# Patient Record
Sex: Female | Born: 1935 | Race: White | Hispanic: No | Smoking: Former smoker
Health system: Southern US, Community
[De-identification: ages and names within clinical notes are randomized; demographics above are authoritative.]

## PROBLEM LIST (undated history)

## (undated) ENCOUNTER — Emergency Department (HOSPITAL_COMMUNITY): Payer: Medicare Other

## (undated) DIAGNOSIS — C801 Malignant (primary) neoplasm, unspecified: Secondary | ICD-10-CM

## (undated) DIAGNOSIS — I639 Cerebral infarction, unspecified: Secondary | ICD-10-CM

## (undated) DIAGNOSIS — E785 Hyperlipidemia, unspecified: Secondary | ICD-10-CM

## (undated) DIAGNOSIS — M8580 Other specified disorders of bone density and structure, unspecified site: Secondary | ICD-10-CM

## (undated) DIAGNOSIS — N3281 Overactive bladder: Secondary | ICD-10-CM

## (undated) DIAGNOSIS — M1990 Unspecified osteoarthritis, unspecified site: Secondary | ICD-10-CM

## (undated) DIAGNOSIS — S82002A Unspecified fracture of left patella, initial encounter for closed fracture: Secondary | ICD-10-CM

## (undated) HISTORY — PX: ANKLE FRACTURE SURGERY: SHX122

## (undated) HISTORY — DX: Other specified disorders of bone density and structure, unspecified site: M85.80

## (undated) HISTORY — PX: GREAT TOE ARTHRODESIS, INTERPHALANGEAL JOINT: SUR55

## (undated) HISTORY — PX: OTHER SURGICAL HISTORY: SHX169

## (undated) HISTORY — PX: TONSILLECTOMY AND ADENOIDECTOMY: SUR1326

---

## 2004-03-29 ENCOUNTER — Ambulatory Visit (HOSPITAL_COMMUNITY): Admission: RE | Admit: 2004-03-29 | Discharge: 2004-03-29 | Payer: Self-pay | Admitting: Family Medicine

## 2007-08-19 ENCOUNTER — Other Ambulatory Visit: Admission: RE | Admit: 2007-08-19 | Discharge: 2007-08-19 | Payer: Self-pay | Admitting: Family Medicine

## 2010-09-16 ENCOUNTER — Encounter: Payer: Self-pay | Admitting: Cardiovascular Disease

## 2011-02-27 ENCOUNTER — Encounter: Payer: Self-pay | Admitting: Cardiovascular Disease

## 2011-02-28 ENCOUNTER — Encounter: Payer: Self-pay | Admitting: Cardiovascular Disease

## 2011-02-28 ENCOUNTER — Ambulatory Visit (INDEPENDENT_AMBULATORY_CARE_PROVIDER_SITE_OTHER): Payer: Medicare Other | Admitting: Cardiovascular Disease

## 2011-02-28 VITALS — BP 134/80 | HR 74 | Resp 18 | Ht 61.0 in | Wt 118.4 lb

## 2011-02-28 DIAGNOSIS — R609 Edema, unspecified: Secondary | ICD-10-CM

## 2011-02-28 NOTE — Progress Notes (Signed)
HPI:  This is an 77 year old woman presenting for initial evaluation. She is the mother of a patient of mine who is followed for coronary disease. [name] is 77 years old and has been remarkably healthy. She only takes calcium and a multivitamin. She has not had any significant medical problems over the years. She specifically denies any history of hypertension, dyslipidemia, or diabetes. She has occasional lower extremity edema but this occurs only rarely. She denies chest pain, dyspnea, palpitations, lightheadedness, orthopnea, or PND. She used to do regular walking but has been occupied taking care of her children. She has been less physically active of late. She denies any symptoms with exertion.  Outpatient Encounter Prescriptions as of 02/28/2011  Medication Sig Dispense Refill  . Calcium Carbonate-Vitamin D (RA CALCIUM PLUS VITAMIN D) 600-400 MG-UNIT per tablet Take 1 tablet by mouth 2 (two) times daily.        . Multiple Vitamin (MULTIVITAMIN) tablet Take 1 tablet by mouth daily.          Review of patient's allergies indicates no known allergies.  Past Medical History  Diagnosis Date  . Osteopenia     Past Surgical History  Procedure Date  . Tonsillectomy and adenoidectomy   . Tarsal tunnel surgery     History   Social History  . Marital Status: Widowed    Spouse Name: N/A    Number of Children: N/A  . Years of Education: N/A   Occupational History  . Not on file.   Social History Main Topics  . Smoking status: Former Smoker    Types: Cigarettes  . Smokeless tobacco: Not on file   Comment: 10-30-1979  . Alcohol Use: Yes     1 or less per day  . Drug Use: No  . Sexually Active: Not on file   Other Topics Concern  . Not on file   Social History Narrative  . No narrative on file    No family history on file.  ROS: General: no fevers/chills/night sweats Eyes: no blurry vision, diplopia, or amaurosis ENT: no sore throat or hearing loss Resp: no cough,  wheezing, or hemoptysis CV: no edema or palpitations GI: no abdominal pain, nausea, vomiting, diarrhea, or constipation GU: no dysuria, frequency, or hematuria Skin: no rash Neuro: no headache, numbness, tingling, or weakness of extremities Musculoskeletal: no joint pain or swelling Heme: no bleeding, DVT, or easy bruising Endo: no polydipsia or polyuria  BP 134/80  Pulse 74  Resp 18  Ht 5\' 1"  (1.549 m)  Wt 118 lb 6.4 oz (53.706 kg)  BMI 22.37 kg/m2  PHYSICAL EXAM: Pt is alert and oriented, delightful elderly woman, WD, WN, in no distress. HEENT: normal Neck: JVP normal. Carotid upstrokes normal without bruits. No thyromegaly. Lungs: equal expansion, clear bilaterally CV: Apex is discrete and nondisplaced, RRR without murmur or gallop Abd: soft, NT, +BS, no bruit, no hepatosplenomegaly Back: no CVA tenderness Ext: no C/C/E        Femoral pulses 2+= without bruits        DP/PT pulses intact and = Skin: warm and dry without rash Neuro: CNII-XII intact             Strength intact = bilaterally  EKG:  Normal sinus rhythm 74 beats per minute, within normal limits.  ASSESSMENT AND PLAN:

## 2011-02-28 NOTE — Patient Instructions (Signed)
Your physician wants you to follow-up in: 1 YEAR.  You will receive a reminder letter in the mail two months in advance. If you don't receive a letter, please call our office to schedule the follow-up appointment.  Your physician recommends that you continue on your current medications as directed. Please refer to the Current Medication list given to you today.  

## 2011-02-28 NOTE — Assessment & Plan Note (Signed)
[name] does not appear to have any significant cardiovascular problems. Her exam and EKG are within normal limits. She does not have any suggestive symptoms of ischemic heart disease. She has had episodic mild edema but this is not appreciable on exam today. She will let me know if this worsens. Otherwise I'll see her back in one year for followup.

## 2011-03-21 ENCOUNTER — Encounter: Payer: Self-pay | Admitting: Cardiovascular Disease

## 2011-07-07 ENCOUNTER — Emergency Department (HOSPITAL_COMMUNITY): Payer: Medicare Other

## 2011-07-07 ENCOUNTER — Emergency Department (HOSPITAL_COMMUNITY)
Admission: EM | Admit: 2011-07-07 | Discharge: 2011-07-07 | Disposition: A | Discharge status: Other Acute Inpatient Hospital | Payer: Medicare Other | Source: Home / Self Care | Attending: Emergency Medicine | Admitting: Emergency Medicine

## 2011-07-07 ENCOUNTER — Inpatient Hospital Stay (HOSPITAL_COMMUNITY)
Admission: EM | Admit: 2011-07-07 | Discharge: 2011-07-09 | DRG: 066 | Disposition: A | Discharge status: Home Health | Payer: Medicare Other | Attending: Internal Medicine | Admitting: Internal Medicine

## 2011-07-07 ENCOUNTER — Ambulatory Visit (HOSPITAL_COMMUNITY): Admission: EM | Admit: 2011-07-07 | Payer: Self-pay | Source: Ambulatory Visit | Admitting: Emergency Medicine

## 2011-07-07 DIAGNOSIS — I6350 Cerebral infarction due to unspecified occlusion or stenosis of unspecified cerebral artery: Secondary | ICD-10-CM | POA: Insufficient documentation

## 2011-07-07 DIAGNOSIS — R29810 Facial weakness: Secondary | ICD-10-CM | POA: Insufficient documentation

## 2011-07-07 DIAGNOSIS — E785 Hyperlipidemia, unspecified: Secondary | ICD-10-CM | POA: Diagnosis present

## 2011-07-07 DIAGNOSIS — R319 Hematuria, unspecified: Secondary | ICD-10-CM | POA: Insufficient documentation

## 2011-07-07 DIAGNOSIS — Z66 Do not resuscitate: Secondary | ICD-10-CM | POA: Diagnosis present

## 2011-07-07 DIAGNOSIS — Z7982 Long term (current) use of aspirin: Secondary | ICD-10-CM

## 2011-07-07 DIAGNOSIS — R1312 Dysphagia, oropharyngeal phase: Secondary | ICD-10-CM | POA: Diagnosis present

## 2011-07-07 LAB — PROTIME-INR
INR: 0.94 (ref 0.00–1.49)
Prothrombin Time: 12.8 s (ref 11.6–15.2)

## 2011-07-07 LAB — COMPREHENSIVE METABOLIC PANEL WITH GFR
ALT: 18 U/L (ref 0–35)
AST: 22 U/L (ref 0–37)
Albumin: 4 g/dL (ref 3.5–5.2)
Alkaline Phosphatase: 96 U/L (ref 39–117)
BUN: 7 mg/dL (ref 6–23)
CO2: 28 meq/L (ref 19–32)
Calcium: 9.4 mg/dL (ref 8.4–10.5)
Chloride: 99 meq/L (ref 96–112)
Creatinine, Ser: 0.53 mg/dL (ref 0.50–1.10)
GFR calc Af Amer: >60 mL/min
GFR calc non Af Amer: >60 mL/min
Glucose, Bld: 94 mg/dL (ref 70–99)
Potassium: 3.5 meq/L (ref 3.5–5.1)
Sodium: 137 meq/L (ref 135–145)
Total Bilirubin: 0.4 mg/dL (ref 0.3–1.2)
Total Protein: 6.8 g/dL (ref 6.0–8.3)

## 2011-07-07 LAB — DIFFERENTIAL
Basophils Absolute: 0 10*3/uL (ref 0.0–0.1)
Basophils Relative: 0 % (ref 0–1)
Eosinophils Absolute: 0.2 10*3/uL (ref 0.0–0.7)
Eosinophils Relative: 2 % (ref 0–5)
Lymphocytes Relative: 19 % (ref 12–46)
Lymphs Abs: 1.8 10*3/uL (ref 0.7–4.0)
Monocytes Absolute: 0.9 10*3/uL (ref 0.1–1.0)
Monocytes Relative: 10 % (ref 3–12)
Neutro Abs: 6.5 10*3/uL (ref 1.7–7.7)
Neutrophils Relative %: 69 % (ref 43–77)

## 2011-07-07 LAB — URINALYSIS, ROUTINE W REFLEX MICROSCOPIC
Glucose, UA: NEGATIVE mg/dL
Hgb urine dipstick: NEGATIVE
Specific Gravity, Urine: 1.018 (ref 1.005–1.030)
pH: 6 (ref 5.0–8.0)

## 2011-07-07 LAB — CBC
HCT: 43.4 % (ref 36.0–46.0)
Hemoglobin: 14.4 g/dL (ref 12.0–15.0)
MCH: 30.5 pg (ref 26.0–34.0)
MCHC: 33.2 g/dL (ref 30.0–36.0)

## 2011-07-07 LAB — GLUCOSE, CAPILLARY: Glucose-Capillary: 85 mg/dL (ref 70–99)

## 2011-07-07 LAB — POCT I-STAT TROPONIN I: Troponin i, poc: 0 ng/mL (ref 0.00–0.08)

## 2011-07-07 MED ORDER — GADOBENATE DIMEGLUMINE 529 MG/ML IV SOLN
11.0000 mL | Freq: Once | INTRAVENOUS | Status: AC | PRN
  Administered 2011-07-07: 11 mL via INTRAVENOUS

## 2011-07-08 DIAGNOSIS — G459 Transient cerebral ischemic attack, unspecified: Secondary | ICD-10-CM

## 2011-07-08 LAB — LIPID PANEL
HDL: 76 mg/dL (ref >39)
Triglycerides: 86 mg/dL (ref <150)

## 2011-07-08 LAB — HEMOGLOBIN A1C: Hgb A1c MFr Bld: 5.5 % (ref <5.7)

## 2011-07-08 NOTE — Consult Note (Signed)
NAMEDEETTE, REVAK NO.:  [id number]  MEDICAL RECORD NO.:  [id number]  LOCATION:  3001                         FACILITY:  [facility]  PHYSICIAN:  Beryle Beams, MD   DATE OF BIRTH:  1928/07/28  DATE OF CONSULTATION:  07/07/2011 DATE OF DISCHARGE:                                CONSULTATION   CONSULTING PHYSICIAN:  Glynn Octave, M.D.  HISTORY:  Ms. Havey is 77 year old white female without significant past medical history, who was evaluated for stroke.  History is according to conversation with Dr. Manus Gunning emergency room physician at [facility].  Ms. Broxterman indicates that she is accompanied by her daughter. Apparently, Mr. Tigue is in usual state health until earlier today. She states sometime this morning she developed the some difficulties with dizziness/unsteadiness.  This apparently persisted through the day and by later the day, she was noted by some friends to currently not look very well and began having some trouble with slurred speech. Around 5 o'clock, she then presented to [facility] Emergency Room with some chief complaint of facial weakness.  She is noted apparently to have some left-sided facial droop.  Initially, the case was discussed with the neurologist on call and an MRI was suggested to rule out the possibility of a stroke versus Bell's palsy.  The MRI of the head revealed a 2.5-cm right acute infarction in the right basal ganglia with some white matter disease.  MRA of the head was unremarkable and MRA of the neck also was unremarkable.  After conversation with myself, she was then transferred to [facility] for further evaluation.  She has had no significant change in her condition. She denies any diplopia, shortness of breath, chest pain, arm or leg weakness or numbness etc.  She has also had no prior similar episodes.  PAST MEDICAL HISTORY:  Mostly unremarkable.  She has been remarkably healthy without any  prior history of diabetes, hypertension, stroke, seizure, kidney or lung problems.  PAST SURGICAL HISTORY:  Ankle surgery.  MEDICATIONS ON ADMISSION:  None.  She was not taking aspirin.  ALLERGIES:  NO KNOWN DRUG ALLERGIES.  SOCIAL HISTORY:  She resides locally in [place].  She does not smoke or use alcohol.  FAMILY HISTORY:  Noncontributory.  REVIEW OF SYSTEMS:  Neurological systems as above.  PHYSICAL EXAMINATION:  GENERAL:  An elderly white female who is currently afebrile. VITAL SIGNS:  Stable.  Blood pressure is 128/73. NEUROLOGIC:  She is alert, appears to be oriented, was mildly dysarthric, but otherwise fluent speech.  She is able to name, repeat and follow commands.  Her extraocular movements are intact.  Pupils equal, round, reactive to light.  There is noted to be a flattening of the left nasolabial fold with dropping of the left lower face.  Palate elevates symmetrically.  Tongue protrudes in the midline.  Otherwise, cranial II to XII appeared to be intact.  Motor examination:  She has normal bulk and tone with 5/5 strength without any focal weakness. Sensory exam reveals symmetric pinprick and vibration.  Cerebellar exam of finger-to-nose, there is no evidence of pronator drift.  Deep tendon reflexes are grade 1+ with toes downgoing. CARDIOVASCULAR:  Regular rate and rhythm without murmur or gallop, with no evidence of carotid bruits, with no evidence of cyanosis, clubbing, or edema. LUNGS:  Clear to auscultation. ABDOMEN:  Soft, nontender, nondistended with normoactive bowel sounds. SKIN:  No obvious cuts, abrasions, or bruises.  LABORATORY STUDIES:  She did have MRI of her head and MRA of her head. As noted above, they were unremarkable.  Head CT showed atrophy, but no bleed.  Her urinalysis was unremarkable.  Chemistry 7 was also unremarkable.  ASSESSMENT:  Right basal ganglia stroke (acute)  DISCUSSION:  At this time, Ms. Engebretson presents with fairly  unremarkable past history and acute onset of some dizziness and left facial weakness and slurred speech.  She has been found to have a right basal ganglia stroke by MRI.  Examination otherwise is nonfocal.  PLAN:  At this time, it is recommended that she undergo a vascular workup including carotid Doppler, echocardiogram, and undergo an telemetry monitoring.  Once cleared by Speech Therapy, she can start an aspirin a day along with the statin.  Speech therapy evaluation should be pursued.  I would practice permissive hypertension at this point. Her current blood pressures were quite acceptable on her blood pressure medicines.  I have discussed the case with the patient and her daughter.  Thank you very much for allowing me to participate in the care of this interesting and pleasant patient.          ______________________________ Beryle Beams, MD     RY/MEDQ  D:  07/07/2011  T:  07/08/2011  Job:  [id number]  Electronically Signed by Beryle Beams MD on 07/08/2011 06:48:44 PM

## 2011-07-08 NOTE — H&P (Signed)
NAMEBRENTLEY, HORRELL NO.:  [id number]  MEDICAL RECORD NO.:  [id number]  LOCATION:  [facility]                         FACILITY:  [facility]  PHYSICIAN:  Seward Speck, MD        DATE OF BIRTH:  11/19/1927  DATE OF ADMISSION:  07/07/2011 DATE OF DISCHARGE:                             HISTORY & PHYSICAL   PRIMARY CARE PHYSICIAN:  Dr. Glenetta Hew.  CHIEF COMPLAINT:  Slurred speech and right facial droop.  HISTORY OF PRESENT ILLNESS:  This is an 77 year old female who presented from [facility] today for the above complaint.  She was in normal state of health and has a very minimal past medical history.  On 10:00 a.m., this morning, while driving she developed the acute onset of dizziness, slurred speech, and right facial droop.  She was going to a funeral and she went to the funeral as planned.  She then went home, told her son and came into the [facility] ER.  Studies there revealed an acute right basal ganglia infarct that was ischemic in nature.  The patient was transferred to [facility] for further care.  She was seen in the ER by Neurology (Dr. Carlyle Lipa) who states she was not a TPA candidate and gave further recommendations.  Currently, she has no new symptoms.  She feels like her symptoms that she developed this morning are stable.  PAST MEDICAL HISTORY:  The patient denies any significant past medical history, including no history of stroke, TIA, myocardial infarction, arrhythmia, or other significant illness.  MEDICATIONS:  None.  ALLERGIES:  NO KNOWN DRUG ALLERGIES.  FAMILY HISTORY:  Significant for mom who died of an MI.  SOCIAL HISTORY:  She lives at home with her son.  She denies any tobacco use.  She has occasional glass of wine.  REVIEW OF SYSTEMS:  A 12-point review of systems was reviewed with the patient and is as in the HPI, otherwise negative.  Code status: DNR   PHYSICAL EXAM:  VITAL SIGNS:  Pulse 80, blood pressure 124/70, respiratory 14,  satting 90% room air, and afebrile. GENERAL/CONSTITUTIONAL:  An elderly female in no acute distress. HEENT:  Moist membranes. NECK:  Supple.  No carotid bruits.  No JVD. CARDIOVASCULAR:  Regular rate and rhythm.  No murmurs. PULMONARY:  Clear to auscultation bilaterally. ABDOMEN:  Soft, nontender. EXTREMITIES:  Warm and well perfused.  No cyanosis, clubbing, or edema x4.  pulses 1+ throughout. SKIN:  Several eschars on her back. PSYCHIATRIC:  Appropriate. NEUROLOGIC:  The patient is alert and oriented x3.  Speech is slurred with difficulty pronouncing M, L, N, K sounds thus she seems to have elements of a buccal, lingual and palatine dysarthria.  She does have left- sided facial droop that is evident at rest and with smiling.  Her motor and sensory exam otherwise seems to be grossly unremarkable.  Ambulation was not observed.  LABORATORY DATA:  CBC and BMP were remarkable.  MRI, MRA of head and neck show acute right basal ganglia ischemic infarct and chronic small-vessel disease elsewhere.  MRA of the head shows no major disease, but she does have some atherosclerotic irregularities in her distal vessels.  MRA  of the neck was essentially unremarkable.  ASSESSMENT: 1. Acute ischemic right basal ganglia infarct, likely due to     cerebrovascular disease. 2. Dizziness, slurred speech, and right facial droop due to the above     plan.  PLAN:  The patient will be admitted to Telemetry.  Neurology has seen her and gave recommendations, she is not a TPA candidate, obviously  due to the prolonged time from onset of symptoms and the time of which she presented.  She will receive 12-lead EKG as well as 2-D echocardiogram for further evaluation of cause as well as being placed on telemetry for 24 hours.  We will repeat lipid panel and A1c.  She failed her bedside swallow thus we will get a formal speech evaluation.  We will also have PT and OT to see her.  We will place her on  heart-healthy diet when she is able to take n.p.o.  we will put her on a baby aspirin as well as Pravachol 40 mg for secondary prevention.  Her blood pressure currently is in the normal range.  She is a DNR per her request (she has a formal DNR signs with her house.  She has DVT prophylaxis in the form of Lovenox.          ______________________________ Seward Speck, MD     DP/MEDQ  D:  07/08/2011  T:  07/08/2011  Job:  [id number]  Electronically Signed by Seward Speck MD on 07/08/2011 03:35:54 AM

## 2011-07-09 ENCOUNTER — Inpatient Hospital Stay (HOSPITAL_COMMUNITY): Payer: Medicare Other

## 2011-07-18 ENCOUNTER — Other Ambulatory Visit (HOSPITAL_COMMUNITY): Payer: Self-pay | Admitting: Otolaryngology

## 2011-07-18 ENCOUNTER — Other Ambulatory Visit (HOSPITAL_COMMUNITY): Payer: Self-pay | Admitting: Family Medicine

## 2011-07-18 DIAGNOSIS — IMO0001 Reserved for inherently not codable concepts without codable children: Secondary | ICD-10-CM

## 2011-07-19 ENCOUNTER — Other Ambulatory Visit (HOSPITAL_COMMUNITY): Payer: Self-pay | Admitting: Family Medicine

## 2011-07-19 DIAGNOSIS — I639 Cerebral infarction, unspecified: Secondary | ICD-10-CM

## 2011-07-30 ENCOUNTER — Ambulatory Visit (HOSPITAL_COMMUNITY)
Admission: RE | Admit: 2011-07-30 | Discharge: 2011-07-30 | Disposition: A | Discharge status: Home / Self Care | Payer: Medicare Other | Source: Ambulatory Visit | Attending: Family Medicine | Admitting: Family Medicine

## 2011-07-30 DIAGNOSIS — I69921 Dysphasia following unspecified cerebrovascular disease: Secondary | ICD-10-CM | POA: Insufficient documentation

## 2011-07-30 DIAGNOSIS — I69991 Dysphagia following unspecified cerebrovascular disease: Secondary | ICD-10-CM | POA: Insufficient documentation

## 2011-07-30 DIAGNOSIS — IMO0001 Reserved for inherently not codable concepts without codable children: Secondary | ICD-10-CM

## 2011-07-30 DIAGNOSIS — I639 Cerebral infarction, unspecified: Secondary | ICD-10-CM

## 2011-08-13 NOTE — Discharge Summary (Signed)
[name], Elliott               ACCOUNT NO.:  [id number]  MEDICAL RECORD NO.:  [id number]  LOCATION:  3001                         FACILITY:  [facility]  PHYSICIAN:  Christina Llano, MD       DATE OF BIRTH:  Jul 19, 1928  DATE OF ADMISSION:  07/07/2011 DATE OF DISCHARGE:  07/09/2011                              DISCHARGE SUMMARY   PRIMARY CARE PHYSICIAN:  Robert L. Foy Guadalajara, MD  REASON FOR ADMISSION:  Slurred speech and right facial droop.  DISCHARGE DIAGNOSES: 1. Acute cerebrovascular accident involving the right basal ganglia. 2. Mild oropharyngeal dysphagia. 3. Dyslipidemia. 4. Pending esophagogram at the time of discharge.  DISCHARGE MEDICATIONS: 1. Aspirin 325 mg p.o. daily. 2. Simvastatin 20 mg p.o. daily. 3. Calcium carbonate 1 tablet p.o. daily. 4. Multivitamin OTC 1 tablet p.o. daily. 5. Vitamin D3 OTC 1 tablet p.o. daily.  BRIEF HISTORY AND EXAMINATION:  [name] is an 77 year old female who lives at home.  The patient is relatively healthy female.  The patient presented to [facility] with slurred speech and right facial droop.  About 10 in the morning on the day of admission, the patient was driving and she developed acute onset of dizziness, slurred speech, right facial droop.  She was going to a funeral and she went to their as she planned.  When she was driving home, she told her son about it, and he brought her to [facility] Emergency Department.  Studies revealed an acute right basal ganglia infarct.  The patient was transferred to [facility] for further evaluation.  The patient be seen by Dr. Olin Pia from Neurology.  RADIOLOGY: 1. 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 2. MRI of the head showed 2.5 cm region of acute infarction affecting     the right basal ganglia to the corona radiata.  There is chronic  small vessel changes elsewhere affecting the basal ganglia and the     hemispheric white matter. 3. The MRA of the head showed negative MR, angiography of the neck     vessels, and negative MRA of the head. 4. Echocardiogram showed ejection fraction of 65% to 70%.  No wall     motion abnormalities on telemetry.  BRIEF HOSPITAL COURSE:  The patient after transfer from [facility] ED to the [facility] for further evaluation, Neurology was consulted.  TPA was not considered since the patient had the symptoms until the patient was presented to emergency department.  The patient was on telemetry, her tele was showing normal sinus rhythm.  Echo, carotid Doppler, MRA of the neck and the head was negative.  The patient was recommended by Neurology to discharge home on aspirin and Zocor, and follow up with them in 2 months.  Physical therapy and occupational therapy recommended home health services. 1. Dysphagia.  The patient did not pass a swallow evaluation done by     the RN in the emergency department.  Speech and Language Pathologyevaluated the patient by MBS and recommended dysphagia III with     nectar-thick liquids.  It is mentioned also that the patient might     have some type of esophageal pouch/diverticulum.  It is not high in     the cervical area to be consistent with Zenker, but is in the low     mid to distal esophagus.  Esophagogram is going to be done..  The     patient will not wait for the results, which is going to be sent to     Dr. Marinda Elk. 2. Dyslipidemia.  The patient's total cholesterol is 215,     triglycerides 86, HDL 76, and LDL is 122.  The patient was started     on Zocor 20 mg.  LFTs at baseline normal with AST 22, ALT 18.     Please note that the patient has no other risk factors to modify.     The patient does not have high blood pressure, does not have     diabetes.  DISCHARGE INSTRUCTIONS: 1. Activity as tolerated. 2. Disposition to home with home  health services including Physical     Therapy and SLP. 3. Diet regular diet with consistency of dysphagia III and nectar-     thick liquids.     Christina Llano, MD     ME/MEDQ  D:  07/09/2011  T:  07/09/2011  Job:  [id number]  cc:   [name], M.D.  Electronically Signed by [name]  on 08/13/2011 01:18:25 PM

## 2011-08-22 ENCOUNTER — Other Ambulatory Visit (HOSPITAL_COMMUNITY): Payer: Self-pay | Admitting: Family Medicine

## 2011-08-31 ENCOUNTER — Ambulatory Visit (HOSPITAL_COMMUNITY): Payer: Medicare Other

## 2011-09-13 ENCOUNTER — Ambulatory Visit (HOSPITAL_COMMUNITY): Payer: Medicare Other

## 2011-09-13 ENCOUNTER — Other Ambulatory Visit (HOSPITAL_COMMUNITY): Payer: Medicare Other

## 2011-09-17 ENCOUNTER — Encounter: Payer: Self-pay | Admitting: Cardiovascular Disease

## 2012-03-04 ENCOUNTER — Encounter: Payer: Self-pay | Admitting: Cardiovascular Disease

## 2012-03-04 ENCOUNTER — Ambulatory Visit (INDEPENDENT_AMBULATORY_CARE_PROVIDER_SITE_OTHER): Payer: Medicare Other | Admitting: Cardiovascular Disease

## 2012-03-04 VITALS — BP 130/68 | HR 74 | Ht 65.0 in | Wt 117.0 lb

## 2012-03-04 DIAGNOSIS — E785 Hyperlipidemia, unspecified: Secondary | ICD-10-CM

## 2012-03-04 DIAGNOSIS — E7800 Pure hypercholesterolemia, unspecified: Secondary | ICD-10-CM

## 2012-03-04 DIAGNOSIS — I639 Cerebral infarction, unspecified: Secondary | ICD-10-CM

## 2012-03-04 DIAGNOSIS — I6350 Cerebral infarction due to unspecified occlusion or stenosis of unspecified cerebral artery: Secondary | ICD-10-CM

## 2012-03-04 NOTE — Patient Instructions (Signed)
Your physician wants you to follow-up in:  12 months.  You will receive a reminder letter in the mail two months in advance. If you don't receive a letter, please call our office to schedule the follow-up appointment.   

## 2012-03-04 NOTE — Progress Notes (Signed)
   HPI:  78 year-old woman presenting for cardiac follow-up evaluation. She has been previously healthy without overt cardiac disease, HTN, or dyslipidemia. However, she presented in September 2012 with an acute CVA involving the right basal ganglia. She's had a good recovery with the only residual symptoms being mild swallowing difficulty and speech impairment only after a long discussion or when she is fatigued. There are no motor deficits. She has had no chest pain, pressure, dyspnea, edema, or palpitations. She underwent usual stroke eval in the hospital and no significant cardiac or carotid disease was identified. She has never had documented atrial fibrillation. She was started on a statin drug during his hospitalization and remains on this.  Outpatient Encounter Prescriptions as of 03/04/2012  Medication Sig Dispense Refill  . aspirin 325 MG tablet Take 325 mg by mouth daily.      . Calcium Carbonate-Vitamin D (RA CALCIUM PLUS VITAMIN D) 600-400 MG-UNIT per tablet Take 1 tablet by mouth 2 (two) times daily.        . Cholecalciferol (VITAMIN D) 1000 UNITS capsule Take 1,000 Units by mouth 2 (two) times daily.      . Multiple Vitamin (MULTIVITAMIN) tablet Take 1 tablet by mouth daily.        . simvastatin (ZOCOR) 20 MG tablet Take 20 mg by mouth every evening.        No Known Allergies  Past Medical History  Diagnosis Date  . Osteopenia     ROS: Negative except as per HPI  BP 130/68  Pulse 74  Ht 5\' 5"  (1.651 m)  Wt 53.071 kg (117 lb)  BMI 19.47 kg/m2  PHYSICAL EXAM: Pt is alert and oriented, pleasant elderly woman in NAD HEENT: normal Neck: JVP - normal, carotids 2+= without bruits Lungs: CTA bilaterally CV: RRR without murmur or gallop Abd: soft, NT, Positive BS, no hepatomegaly Ext: no C/C/E, distal pulses intact and equal Skin: warm/dry no rash  EKG:  NSR 74 bpm, within normal limits  ASSESSMENT AND PLAN: 1. Cryptogenic stroke - I'm always concerned about atrial  fibrillation in an elderly woman with cryptogenic stroke, but this has never been documented and the patient is completely asymptomatic. We discussed the need for evaluation if she develops palpitations. On appropriate Rx with ASA and a statin drug.  2. Dyslipidemia - on simvastatin. Followed by Dr Foy Guadalajara. Labs 08/2011: chol 198, LDL 102, HDL 72, Trig 74  3. Follow-up - I will see her back in 12 months or sooner if problems arise.  Tonny Bollman 03/07/2012 11:39 PM

## 2012-03-07 DIAGNOSIS — I639 Cerebral infarction, unspecified: Secondary | ICD-10-CM | POA: Insufficient documentation

## 2012-07-12 ENCOUNTER — Emergency Department (HOSPITAL_COMMUNITY)
Admission: EM | Admit: 2012-07-12 | Discharge: 2012-07-12 | Disposition: A | Discharge status: Home / Self Care | Payer: Medicare Other | Attending: Emergency Medicine | Admitting: Emergency Medicine

## 2012-07-12 ENCOUNTER — Encounter (HOSPITAL_COMMUNITY): Payer: Self-pay | Admitting: *Deleted

## 2012-07-12 ENCOUNTER — Emergency Department (HOSPITAL_COMMUNITY): Payer: Medicare Other

## 2012-07-12 DIAGNOSIS — W010XXA Fall on same level from slipping, tripping and stumbling without subsequent striking against object, initial encounter: Secondary | ICD-10-CM | POA: Insufficient documentation

## 2012-07-12 DIAGNOSIS — S82002A Unspecified fracture of left patella, initial encounter for closed fracture: Secondary | ICD-10-CM

## 2012-07-12 DIAGNOSIS — Y92009 Unspecified place in unspecified non-institutional (private) residence as the place of occurrence of the external cause: Secondary | ICD-10-CM | POA: Insufficient documentation

## 2012-07-12 DIAGNOSIS — Z87891 Personal history of nicotine dependence: Secondary | ICD-10-CM | POA: Insufficient documentation

## 2012-07-12 DIAGNOSIS — S82009A Unspecified fracture of unspecified patella, initial encounter for closed fracture: Secondary | ICD-10-CM | POA: Insufficient documentation

## 2012-07-12 DIAGNOSIS — S5290XA Unspecified fracture of unspecified forearm, initial encounter for closed fracture: Secondary | ICD-10-CM

## 2012-07-12 DIAGNOSIS — M899 Disorder of bone, unspecified: Secondary | ICD-10-CM | POA: Insufficient documentation

## 2012-07-12 DIAGNOSIS — S52599A Other fractures of lower end of unspecified radius, initial encounter for closed fracture: Secondary | ICD-10-CM | POA: Insufficient documentation

## 2012-07-12 DIAGNOSIS — Z8673 Personal history of transient ischemic attack (TIA), and cerebral infarction without residual deficits: Secondary | ICD-10-CM | POA: Insufficient documentation

## 2012-07-12 HISTORY — DX: Cerebral infarction, unspecified: I63.9

## 2012-07-12 MED ORDER — TETANUS-DIPHTH-ACELL PERTUSSIS 5-2.5-18.5 LF-MCG/0.5 IM SUSP
0.5000 mL | Freq: Once | INTRAMUSCULAR | Status: DC
  Filled 2012-07-12: qty 0.5

## 2012-07-12 MED ORDER — ACETAMINOPHEN 325 MG PO TABS
650.0000 mg | ORAL_TABLET | Freq: Once | ORAL | Status: AC
  Administered 2012-07-12: 650 mg via ORAL
  Filled 2012-07-12: qty 2

## 2012-07-12 MED ORDER — OXYCODONE-ACETAMINOPHEN 5-325 MG PO TABS: 0.5000 | ORAL_TABLET | ORAL | Status: AC | PRN

## 2012-07-12 NOTE — ED Notes (Signed)
Ortho tech at bedside 

## 2012-07-12 NOTE — ED Notes (Signed)
Pt reports she was cleaning leaves out of her pool and tripped. Pt reports she fell on her left wrist and left knee. Pt denies LOC. Pt reports pain to left wrist and left knee. Deformity noted to left wrist. Swelling noted to left knee.

## 2012-07-12 NOTE — ED Provider Notes (Signed)
History     CSN: [id number]  Arrival date & time 07/12/12  1130   First MD Initiated Contact with Patient 07/12/12 1219      Chief Complaint  Patient presents with  . Fall  . Wrist Pain  . Knee Pain    (Consider location/radiation/quality/duration/timing/severity/associated sxs/prior treatment) HPI  Patient tripped and fell in yard today.  She has pain in her left wrist and left knee.  She did not lose consciousness and did not strike her head.  She denies other injury.  She has ambulated since the fall at 10 a.m. Denies numbness, tingling, or focal weakness.   Past Medical History  Diagnosis Date  . Osteopenia   . CVA (cerebral infarction)     Past Surgical History  Procedure Date  . Tonsillectomy and adenoidectomy   . Tarsal tunnel surgery     Family History  Problem Relation Age of Onset  . Heart disease Mother     History  Substance Use Topics  . Smoking status: Former Smoker    Types: Cigarettes  . Smokeless tobacco: Not on file   Comment: 10-30-1979  . Alcohol Use: Yes     1 or less per day    OB History    Grav Para Term Preterm Abortions TAB SAB Ect Mult Living                  Review of Systems  Constitutional: Negative for fever, chills, activity change, appetite change and unexpected weight change.  HENT: Negative for sore throat, rhinorrhea, neck pain, neck stiffness and sinus pressure.   Eyes: Negative for visual disturbance.  Respiratory: Negative for cough and shortness of breath.   Cardiovascular: Negative for chest pain and leg swelling.  Gastrointestinal: Negative for vomiting, abdominal pain, diarrhea and blood in stool.  Genitourinary: Negative for dysuria, urgency, frequency, vaginal discharge and difficulty urinating.  Musculoskeletal: Negative for myalgias, arthralgias and gait problem.  Skin: Negative for color change and rash.  Neurological: Negative for weakness, light-headedness and headaches.  Hematological: Does not  bruise/bleed easily.  Psychiatric/Behavioral: Negative for dysphoric mood.    Allergies  Review of patient's allergies indicates no known allergies.  Home Medications   Current Outpatient Rx  Name Route Sig Dispense Refill  . ASPIRIN 325 MG PO TABS Oral Take 325 mg by mouth daily.    Marland Kitchen CALCIUM CARBONATE-VITAMIN D 600-400 MG-UNIT PO TABS Oral Take 1 tablet by mouth 2 (two) times daily.      Marland Kitchen VITAMIN D 1000 UNITS PO CAPS Oral Take 1,000 Units by mouth 2 (two) times daily.    . DENOSUMAB 60 MG/ML [place] SOLN Subcutaneous Inject 60 mg into the skin every 6 (six) months. Administer in upper arm, thigh, or abdomen    . ONE-DAILY MULTI VITAMINS PO TABS Oral Take 1 tablet by mouth daily.      Marland Kitchen SIMVASTATIN 20 MG PO TABS Oral Take 20 mg by mouth every evening.      BP 157/81  Pulse 86  Temp 97.6 F (36.4 C) (Oral)  Resp 18  SpO2 98%  Physical Exam  Nursing note and vitals reviewed. Constitutional: She appears well-developed and well-nourished.  HENT:  Head: Normocephalic and atraumatic.  Eyes: Conjunctivae normal and EOM are normal. Pupils are equal, round, and reactive to light.  Neck: Normal range of motion. Neck supple.  Cardiovascular: Normal rate, regular rhythm, normal heart sounds and intact distal pulses.   Pulmonary/Chest: Effort normal and breath sounds normal.  Abdominal:  Soft. Bowel sounds are normal.  Musculoskeletal: She exhibits tenderness.         Left wrist is swollen and tender abrasion over dorsal aspect of hand, radial pulse 2 plus, fingers pink with active rom, elbow and shoulder nontender.   Full arom of left hip and knee Left knee tender anterior aspect over patella, nontender lateral and medial aspect, dp intact and sensation intact distal to injury.   Entire spine is nontender.    Neurological: She is alert.  Skin: Skin is warm and dry.  Psychiatric: She has a normal mood and affect. Thought content normal.    ED Course  Procedures (including critical care  time)  Labs Reviewed - No data to display Dg Wrist Complete Left  07/12/2012  *RADIOLOGY REPORT*  Clinical Data: Larey Seat and injured left wrist.  LEFT WRIST - COMPLETE 3+ VIEW  Comparison: No prior wrist imaging.  Right hand x-rays obtained concurrently.  Findings: Comminuted intra-articular distal radius fracture with dorsal angulation.  No other fractures.  Osteopenia.  Joint space narrowing involving the trapezium - first metacarpal joint.  IMPRESSION: Comminuted intra-articular distal radius fracture with dorsal angulation.   Original Report Authenticated By: Arnell Sieving, M.D.    Dg Knee Complete 4 Views Left  07/12/2012  *RADIOLOGY REPORT*  Clinical Data: Larey Seat in the left knee.  LEFT KNEE - COMPLETE 4+ VIEW  Comparison: None.  Findings: Comminuted fracture involving the patella.  No other fractures.  Osteopenia.  Well-preserved joint spaces for age. Small joint effusion/hemarthrosis.  IMPRESSION: Comminuted patellar fracture.   Original Report Authenticated By: Arnell Sieving, M.D.    Dg Hand Complete Left  07/12/2012  *RADIOLOGY REPORT*  Clinical Data: Larey Seat and injured left hand.  LEFT HAND - COMPLETE 3+ VIEW  Comparison: None.  Findings: No acute fractures involving the bones of the hand. Severe osteopenia.  Relatively well-preserved joint spaces for age. See please see report of the a concurrent left wrist imaging.  IMPRESSION: No acute osseous abnormalities involving the bones of the hand. Severe osteopenia.   Original Report Authenticated By: Arnell Sieving, M.D.      No diagnosis found.   Discussed patella fx with Dr. Lequita Halt and plan knee immobilizer and follow up for patellar fx with Dr. Claire Shown.   MDM  Left wrist fracture discussed with Dr. Merlyn Lot.  Conservative vs operative treatment discussed with patient, family and in consultation with Dr. Merlyn Lot.  Plan splint and patient will call Dr. Merlyn Lot Monday am to be seen and arrange for orif this week.    Patient with good  pain control here with immobilization, ice, and tylenol.  Finger pink with good sensation, radial pulse intact.       Hilario Quarry, MD 07/12/12 1450

## 2012-07-14 ENCOUNTER — Encounter (HOSPITAL_BASED_OUTPATIENT_CLINIC_OR_DEPARTMENT_OTHER): Payer: Self-pay | Admitting: *Deleted

## 2012-07-14 ENCOUNTER — Other Ambulatory Visit: Payer: Self-pay | Admitting: Orthopedic Surgery

## 2012-07-14 NOTE — Progress Notes (Signed)
Pt fell-fx lt wrist and injured lt knee-in a knee imobilizer-splint-sling  No cardiac or resp problems-had a cva 9/12-on asa-no residual

## 2012-07-15 ENCOUNTER — Encounter (HOSPITAL_BASED_OUTPATIENT_CLINIC_OR_DEPARTMENT_OTHER): Payer: Self-pay | Admitting: Anesthesiology

## 2012-07-15 ENCOUNTER — Ambulatory Visit (HOSPITAL_BASED_OUTPATIENT_CLINIC_OR_DEPARTMENT_OTHER)
Admission: RE | Admit: 2012-07-15 | Discharge: 2012-07-15 | Disposition: A | Discharge status: Home / Self Care | Payer: Medicare Other | Source: Ambulatory Visit | Attending: Orthopedic Surgery | Admitting: Orthopedic Surgery

## 2012-07-15 ENCOUNTER — Encounter (HOSPITAL_BASED_OUTPATIENT_CLINIC_OR_DEPARTMENT_OTHER): Payer: Self-pay | Admitting: Orthopedic Surgery

## 2012-07-15 ENCOUNTER — Encounter (HOSPITAL_BASED_OUTPATIENT_CLINIC_OR_DEPARTMENT_OTHER)
Admission: RE | Disposition: A | Discharge status: Home / Self Care | Payer: Self-pay | Source: Ambulatory Visit | Attending: Orthopedic Surgery

## 2012-07-15 ENCOUNTER — Ambulatory Visit (HOSPITAL_BASED_OUTPATIENT_CLINIC_OR_DEPARTMENT_OTHER): Payer: Medicare Other | Admitting: Anesthesiology

## 2012-07-15 DIAGNOSIS — E785 Hyperlipidemia, unspecified: Secondary | ICD-10-CM | POA: Insufficient documentation

## 2012-07-15 DIAGNOSIS — M899 Disorder of bone, unspecified: Secondary | ICD-10-CM | POA: Insufficient documentation

## 2012-07-15 DIAGNOSIS — S52599A Other fractures of lower end of unspecified radius, initial encounter for closed fracture: Secondary | ICD-10-CM | POA: Insufficient documentation

## 2012-07-15 DIAGNOSIS — W19XXXA Unspecified fall, initial encounter: Secondary | ICD-10-CM | POA: Insufficient documentation

## 2012-07-15 DIAGNOSIS — Z8673 Personal history of transient ischemic attack (TIA), and cerebral infarction without residual deficits: Secondary | ICD-10-CM | POA: Insufficient documentation

## 2012-07-15 HISTORY — DX: Unspecified osteoarthritis, unspecified site: M19.90

## 2012-07-15 HISTORY — DX: Hyperlipidemia, unspecified: E78.5

## 2012-07-15 SURGERY — OPEN REDUCTION INTERNAL FIXATION (ORIF) DISTAL RADIUS FRACTURE
Anesthesia: General | Site: Wrist | Laterality: Left | Wound class: Clean

## 2012-07-15 MED ORDER — BUPIVACAINE-EPINEPHRINE PF 0.5-1:200000 % IJ SOLN
INTRAMUSCULAR | Status: DC | PRN
  Administered 2012-07-15: 25 mL

## 2012-07-15 MED ORDER — CHLORHEXIDINE GLUCONATE 4 % EX LIQD: 60.0000 mL | Freq: Once | CUTANEOUS | Status: DC

## 2012-07-15 MED ORDER — OXYCODONE HCL 5 MG PO TABS: 5.0000 mg | ORAL_TABLET | Freq: Once | ORAL | Status: DC | PRN

## 2012-07-15 MED ORDER — HYDROCODONE-ACETAMINOPHEN 5-325 MG PO TABS: ORAL_TABLET | ORAL | Status: DC

## 2012-07-15 MED ORDER — FENTANYL CITRATE 0.05 MG/ML IJ SOLN
50.0000 ug | Freq: Once | INTRAMUSCULAR | Status: AC
  Administered 2012-07-15: 50 ug via INTRAVENOUS

## 2012-07-15 MED ORDER — LACTATED RINGERS IV SOLN
INTRAVENOUS | Status: DC
  Administered 2012-07-15: 10:00:00 via INTRAVENOUS

## 2012-07-15 MED ORDER — DEXAMETHASONE SODIUM PHOSPHATE 4 MG/ML IJ SOLN
INTRAMUSCULAR | Status: DC | PRN
  Administered 2012-07-15: 10 mg via INTRAVENOUS

## 2012-07-15 MED ORDER — ONDANSETRON HCL 4 MG/2ML IJ SOLN: 4.0000 mg | Freq: Once | INTRAMUSCULAR | Status: DC | PRN

## 2012-07-15 MED ORDER — HYDROMORPHONE HCL PF 1 MG/ML IJ SOLN: 0.2500 mg | INTRAMUSCULAR | Status: DC | PRN

## 2012-07-15 MED ORDER — OXYCODONE HCL 5 MG/5ML PO SOLN: 5.0000 mg | Freq: Once | ORAL | Status: DC | PRN

## 2012-07-15 MED ORDER — CEFAZOLIN SODIUM 1-5 GM-% IV SOLN
1.0000 g | Freq: Once | INTRAVENOUS | Status: AC
  Administered 2012-07-15: 2 g via INTRAVENOUS

## 2012-07-15 MED ORDER — PROPOFOL 10 MG/ML IV BOLUS
INTRAVENOUS | Status: DC | PRN
  Administered 2012-07-15: 100 mg via INTRAVENOUS

## 2012-07-15 MED ORDER — ONDANSETRON HCL 4 MG/2ML IJ SOLN
4.0000 mg | Freq: Once | INTRAMUSCULAR | Status: AC
  Administered 2012-07-15: 4 mg via INTRAVENOUS

## 2012-07-15 MED ORDER — FENTANYL CITRATE 0.05 MG/ML IJ SOLN
50.0000 ug | Freq: Once | INTRAMUSCULAR | Status: AC
  Administered 2012-07-15: 100 ug via INTRAVENOUS

## 2012-07-15 MED ORDER — ONDANSETRON HCL 4 MG/2ML IJ SOLN
INTRAMUSCULAR | Status: DC | PRN
  Administered 2012-07-15: 4 mg via INTRAVENOUS

## 2012-07-15 MED ORDER — LIDOCAINE HCL (CARDIAC) 20 MG/ML IV SOLN
INTRAVENOUS | Status: DC | PRN
  Administered 2012-07-15: 40 mg via INTRAVENOUS

## 2012-07-15 MED ORDER — 0.9 % SODIUM CHLORIDE (POUR BTL) OPTIME
TOPICAL | Status: DC | PRN
  Administered 2012-07-15: 200 mL

## 2012-07-15 SURGICAL SUPPLY — 76 items
BANDAGE CONFORM 3  STR LF (GAUZE/BANDAGES/DRESSINGS) IMPLANT
BANDAGE ELASTIC 3 VELCRO ST LF (GAUZE/BANDAGES/DRESSINGS) ×2 IMPLANT
BANDAGE GAUZE ELAST BULKY 4 IN (GAUZE/BANDAGES/DRESSINGS) ×2 IMPLANT
BIT DRILL 2.0 LNG QUCK RELEASE (BIT) IMPLANT
BIT DRILL 2.8X5 QR DISP (BIT) ×1 IMPLANT
BLADE MINI RND TIP GREEN BEAV (BLADE) IMPLANT
BLADE SURG 15 STRL LF DISP TIS (BLADE) ×2 IMPLANT
BLADE SURG 15 STRL SS (BLADE) ×4
BNDG CMPR 9X4 STRL LF SNTH (GAUZE/BANDAGES/DRESSINGS) ×1
BNDG CMPR MD 5X2 ELC HKLP STRL (GAUZE/BANDAGES/DRESSINGS) ×1
BNDG ELASTIC 2 VLCR STRL LF (GAUZE/BANDAGES/DRESSINGS) ×2 IMPLANT
BNDG ESMARK 4X9 LF (GAUZE/BANDAGES/DRESSINGS) ×2 IMPLANT
CHLORAPREP W/TINT 26ML (MISCELLANEOUS) ×2 IMPLANT
CLOTH BEACON ORANGE TIMEOUT ST (SAFETY) ×2 IMPLANT
CORDS BIPOLAR (ELECTRODE) ×2 IMPLANT
COVER MAYO STAND STRL (DRAPES) ×2 IMPLANT
COVER TABLE BACK 60X90 (DRAPES) ×2 IMPLANT
DRAPE EXTREMITY T 121X128X90 (DRAPE) ×2 IMPLANT
DRAPE OEC MINIVIEW 54X84 (DRAPES) ×2 IMPLANT
DRAPE SURG 17X23 STRL (DRAPES) ×2 IMPLANT
DRILL 2.0 LNG QUICK RELEASE (BIT) ×2
GAUZE XEROFORM 1X8 LF (GAUZE/BANDAGES/DRESSINGS) ×2 IMPLANT
GLOVE BIO SURGEON STRL SZ 6.5 (GLOVE) ×1 IMPLANT
GLOVE BIO SURGEON STRL SZ7.5 (GLOVE) ×2 IMPLANT
GLOVE BIOGEL PI IND STRL 7.0 (GLOVE) IMPLANT
GLOVE BIOGEL PI IND STRL 8 (GLOVE) ×1 IMPLANT
GLOVE BIOGEL PI IND STRL 8.5 (GLOVE) IMPLANT
GLOVE BIOGEL PI INDICATOR 7.0 (GLOVE) ×1
GLOVE BIOGEL PI INDICATOR 8 (GLOVE) ×1
GLOVE BIOGEL PI INDICATOR 8.5 (GLOVE)
GLOVE INDICATOR 7.0 STRL GRN (GLOVE) ×1 IMPLANT
GLOVE INDICATOR 8.5 STRL (GLOVE) ×1 IMPLANT
GLOVE SURG ORTHO 8.0 STRL STRW (GLOVE) ×1 IMPLANT
GOWN PREVENTION PLUS XLARGE (GOWN DISPOSABLE) ×2 IMPLANT
GOWN STRL REIN XL XLG (GOWN DISPOSABLE) ×3 IMPLANT
GUIDEWIRE ORTHO 0.054X6 (WIRE) ×3 IMPLANT
NDL HYPO 25X1 1.5 SAFETY (NEEDLE) IMPLANT
NEEDLE HYPO 22GX1.5 SAFETY (NEEDLE) IMPLANT
NEEDLE HYPO 25X1 1.5 SAFETY (NEEDLE) IMPLANT
NS IRRIG 1000ML POUR BTL (IV SOLUTION) ×2 IMPLANT
PACK BASIN DAY SURGERY FS (CUSTOM PROCEDURE TRAY) ×2 IMPLANT
PAD CAST 3X4 CTTN HI CHSV (CAST SUPPLIES) ×1 IMPLANT
PAD CAST 4YDX4 CTTN HI CHSV (CAST SUPPLIES) IMPLANT
PADDING CAST ABS 4INX4YD NS (CAST SUPPLIES) ×1
PADDING CAST ABS COTTON 4X4 ST (CAST SUPPLIES) ×1 IMPLANT
PADDING CAST COTTON 3X4 STRL (CAST SUPPLIES) ×2
PADDING CAST COTTON 4X4 STRL (CAST SUPPLIES)
PLATE LEFT DIST RADIUS NARROW (Plate) ×1 IMPLANT
SCREW 3.5MMX10.0MM (Screw) ×1 IMPLANT
SCREW 3.5MMX12.0MM (Screw) ×2 IMPLANT
SCREW CORT FT 18X2.3XLCK HEX (Screw) IMPLANT
SCREW CORT FT 20X2.3XLCK HEX (Screw) IMPLANT
SCREW CORTICAL LOCKING 2.3X16M (Screw) ×4 IMPLANT
SCREW CORTICAL LOCKING 2.3X18M (Screw) ×4 IMPLANT
SCREW CORTICAL LOCKING 2.3X20M (Screw) ×4 IMPLANT
SCREW FX16X2.3XLCK SMTH NS CRT (Screw) IMPLANT
SCREW FX18X2.3XSMTH LCK NS CRT (Screw) IMPLANT
SCREW FX20X2.3XSMTH LCK NS CRT (Screw) IMPLANT
SCREW NON TOGG 2.3X18MM (Screw) ×1 IMPLANT
SLEEVE SCD COMPRESS KNEE MED (MISCELLANEOUS) IMPLANT
SPLINT PLASTER CAST XFAST 4X15 (CAST SUPPLIES) IMPLANT
SPLINT PLASTER XTRA FAST SET 4 (CAST SUPPLIES) ×10
SPONGE GAUZE 4X4 12PLY (GAUZE/BANDAGES/DRESSINGS) ×2 IMPLANT
STOCKINETTE 4X48 STRL (DRAPES) ×2 IMPLANT
SUCTION FRAZIER TIP 10 FR DISP (SUCTIONS) IMPLANT
SUT ETHILON 3 0 PS 1 (SUTURE) ×1 IMPLANT
SUT ETHILON 4 0 PS 2 18 (SUTURE) IMPLANT
SUT VIC AB 3-0 PS1 18 (SUTURE) ×2
SUT VIC AB 3-0 PS1 18XBRD (SUTURE) IMPLANT
SUT VICRYL 4-0 PS2 18IN ABS (SUTURE) ×1 IMPLANT
SYR BULB 3OZ (MISCELLANEOUS) ×2 IMPLANT
SYR CONTROL 10ML LL (SYRINGE) IMPLANT
TOWEL OR 17X24 6PK STRL BLUE (TOWEL DISPOSABLE) ×4 IMPLANT
TUBE CONNECTING 20X1/4 (TUBING) IMPLANT
UNDERPAD 30X30 INCONTINENT (UNDERPADS AND DIAPERS) ×2 IMPLANT
WATER STERILE IRR 1000ML POUR (IV SOLUTION) ×1 IMPLANT

## 2012-07-15 NOTE — Anesthesia Preprocedure Evaluation (Addendum)
Anesthesia Evaluation  Patient identified by MRN, date of birth, ID band Patient awake    Reviewed: Allergy & Precautions, H&P , NPO status , Patient's Chart, lab work & pertinent test results  Airway Mallampati: I TM Distance: >3 FB Neck ROM: Full    Dental  (+) Teeth Intact and Dental Advisory Given   Pulmonary  breath sounds clear to auscultation        Cardiovascular Rhythm:Regular Rate:Normal     Neuro/Psych CVA, No Residual Symptoms    GI/Hepatic   Endo/Other    Renal/GU      Musculoskeletal   Abdominal   Peds  Hematology   Anesthesia Other Findings   Reproductive/Obstetrics                           Anesthesia Physical Anesthesia Plan  ASA: II  Anesthesia Plan: General   Post-op Pain Management:    Induction: Intravenous  Airway Management Planned: LMA  Additional Equipment:   Intra-op Plan:   Post-operative Plan: Extubation in OR  Informed Consent: I have reviewed the patients History and Physical, chart, labs and discussed the procedure including the risks, benefits and alternatives for the proposed anesthesia with the patient or authorized representative who has indicated his/her understanding and acceptance.   Dental advisory given  Plan Discussed with:   Anesthesia Plan Comments:         Anesthesia Quick Evaluation

## 2012-07-15 NOTE — H&P (Signed)
  [name] is an 78 y.o. female.   Chief Complaint: left distal radius fracture HPI: 78 yo rhd female fell on 07/12/12 injuring left wrist and left knee.  Seen at [facility] where XR revealed left distal radius fracture.  Splinted and followed up in office.  Reports no previous injury to left wrist.  Past Medical History  Diagnosis Date  . Osteopenia   . CVA (cerebral infarction)   . Hyperlipemia   . Arthritis     Past Surgical History  Procedure Date  . Tonsillectomy and adenoidectomy   . Tarsal tunnel surgery   . Ankle fracture surgery     rt  . Great toe arthrodesis, interphalangeal joint     rt    Family History  Problem Relation Age of Onset  . Heart disease Mother    Social History:  reports that she has quit smoking. Her smoking use included Cigarettes. She does not have any smokeless tobacco history on file. She reports that she drinks alcohol. She reports that she does not use illicit drugs.  Allergies: No Known Allergies  Medications Prior to Admission  Medication Sig Dispense Refill  . aspirin 325 MG tablet Take 325 mg by mouth daily.      . Calcium Carbonate-Vitamin D (RA CALCIUM PLUS VITAMIN D) 600-400 MG-UNIT per tablet Take 1 tablet by mouth 2 (two) times daily.        . Cholecalciferol (VITAMIN D) 1000 UNITS capsule Take 1,000 Units by mouth 2 (two) times daily.      Marland Kitchen denosumab (PROLIA) 60 MG/ML SOLN injection Inject 60 mg into the skin every 6 (six) months. Administer in upper arm, thigh, or abdomen      . Multiple Vitamin (MULTIVITAMIN) tablet Take 1 tablet by mouth daily.        Marland Kitchen oxyCODONE-acetaminophen (PERCOCET/ROXICET) 5-325 MG per tablet Take 0.5 tablets by mouth every 4 (four) hours as needed for pain (use as needed only- consider use at bedtime if night pain is a problem. ).  15 tablet  0  . simvastatin (ZOCOR) 20 MG tablet Take 20 mg by mouth every evening.        No results found for this or any previous visit (from the past 48 hour(s)).  No  results found.   A comprehensive review of systems was negative except for: Neurological: positive for balance problems  Height 5\' 5"  (1.651 m), weight 53.071 kg (117 lb).  General appearance: alert, cooperative and appears stated age Head: Normocephalic, without obvious abnormality, atraumatic Neck: supple, symmetrical, trachea midline Resp: clear to auscultation bilaterally Cardio: regular rate and rhythm GI: soft, non-tender; bowel sounds normal; no masses,  no organomegaly Extremities: light touch sensation and capillary refill intact all digits.  +epl/fpl/io.  skin intact.  compartments soft.  ttp left distal radius. Pulses: 2+ and symmetric Skin: Skin color, texture, turgor normal. No rashes or lesions Neurologic: Grossly normal Incision/Wound: na  Assessment/Plan Left distal radius fracture.  Discussed non operative and operative treatment options.  She wishes to proceed with operative fixation.  Risks, benefits, and alternatives of surgery were discussed and the patient agrees with the plan of care.   Hamdan Toscano R 07/15/2012, 8:36 AM

## 2012-07-15 NOTE — Anesthesia Procedure Notes (Addendum)
Anesthesia Regional Block:  Supraclavicular block  Pre-Anesthetic Checklist: ,, timeout performed, Correct Patient, Correct Site, Correct Laterality, Correct Procedure, Correct Position, site marked, Risks and benefits discussed,  Surgical consent,  Pre-op evaluation,  At surgeon's request and post-op pain management  Laterality: Left and Upper  Prep: chloraprep       Needles:   Needle Type: Echogenic Needle     Needle Length: 4cm  Needle Gauge: 21    Additional Needles:  Procedures: ultrasound guided Supraclavicular block Narrative:  Start time: 07/15/2012 9:42 AM End time: 07/15/2012 9:50 AM Injection made incrementally with aspirations every 5 mL.  Performed by: Personally  Anesthesiologist: Sheldon Silvan, MD  Interscalene brachial plexus block Procedure Name: LMA Insertion Performed by: York Grice Pre-anesthesia Checklist: Patient identified, Timeout performed, Emergency Drugs available, Suction available and Patient being monitored Patient Re-evaluated:Patient Re-evaluated prior to inductionOxygen Delivery Method: Circle system utilized Preoxygenation: Pre-oxygenation with 100% oxygen Intubation Type: IV induction Ventilation: Mask ventilation without difficulty LMA: LMA inserted LMA Size: 3.0 Number of attempts: 1 Placement Confirmation: breath sounds checked- equal and bilateral and positive ETCO2 Tube secured with: Tape Dental Injury: Teeth and Oropharynx as per pre-operative assessment

## 2012-07-15 NOTE — Progress Notes (Signed)
  Assisted Dr. Crews with left, ultrasound guided, supraclavicular block. Side rails up, monitors on throughout procedure. See vital signs in flow sheet. Tolerated Procedure well. 

## 2012-07-15 NOTE — Transfer of Care (Signed)
Immediate Anesthesia Transfer of Care Note  Patient: [name]  Procedure(s) Performed: Procedure(s) (LRB) with comments: OPEN REDUCTION INTERNAL FIXATION (ORIF) DISTAL RADIAL FRACTURE (Left)  Patient Location: PACU  Anesthesia Type: General  Level of Consciousness: awake  Airway & Oxygen Therapy: Patient Spontanous Breathing and Patient connected to face mask oxygen  Post-op Assessment: Report given to PACU RN and Post -op Vital signs reviewed and stable  Post vital signs: Reviewed and stable  Complications: No apparent anesthesia complications

## 2012-07-15 NOTE — Op Note (Signed)
Dictation [id number]

## 2012-07-15 NOTE — Anesthesia Postprocedure Evaluation (Signed)
  Anesthesia Post-op Note  Patient: [name]  Procedure(s) Performed: Procedure(s) (LRB) with comments: OPEN REDUCTION INTERNAL FIXATION (ORIF) DISTAL RADIAL FRACTURE (Left)  Patient Location: PACU  Anesthesia Type: GA combined with regional for post-op pain  Level of Consciousness: awake, alert  and oriented  Airway and Oxygen Therapy: Patient Spontanous Breathing and Patient connected to face mask oxygen  Post-op Pain: none  Post-op Assessment: Post-op Vital signs reviewed  Post-op Vital Signs: Reviewed  Complications: No apparent anesthesia complications

## 2012-07-16 NOTE — Op Note (Signed)
[name], [name]               ACCOUNT NO.:  [id number]  MEDICAL RECORD NO.:  [id number]  LOCATION:                                 FACILITY:  PHYSICIAN:  Betha Loa, MD        DATE OF BIRTH:  1927-12-19  DATE OF PROCEDURE:  07/15/2012 DATE OF DISCHARGE:                              OPERATIVE REPORT   PREOPERATIVE DIAGNOSIS:  Left comminuted intra-articular distal radius fracture.  POSTOPERATIVE DIAGNOSIS:  Left comminuted intra-articular distal radius fracture.  PROCEDURE:  Open reduction and internal fixation, left comminuted intra- articular distal radius fracture.  SURGEON:  Betha Loa, MD  ASSISTANT:  Cindee Salt, MD  ANESTHESIA:  General with regional.  IV FLUIDS:  Per anesthesia flow sheet.  ESTIMATED BLOOD LOSS:  Minimal.  COMPLICATIONS:  None.  SPECIMENS:  None.  TOURNIQUET TIME:  40 minutes.  DISPOSITION:  Stable to PACU.  INDICATIONS:  [name] is an 78 year old right-hand-dominant female who fell 3 days ago onto her left arm.  She was seen at [facility] Department where radiographs were taken revealing a left distal radius fracture.  She followed up with me in the office.  On evaluation, she had intact sensation and capillary refill in all fingertips.  She can flex and extend the IP joint of her thumb across fingers.  She had dorsal angulation and displacement of the distal radial fragment.  I recommended open reduction and internal fixation. Nonoperative and operative treatment options were reviewed.  Risks, benefits, and alternatives of surgery were discussed including risk of blood loss, infection, damage to nerves, vessels, tendons, ligaments, bone, failure to surgery, need for additional surgery, complications with wound healing; continued pain; nonunion; malunion; stiffness.  She voiced understanding of these risks and elected to proceed.  OPERATIVE COURSE:  After being identified preoperatively by myself, the patient and I  agreed upon procedure and site procedure.  Surgical site was marked.  The risks, benefits, and alternatives of surgery were reviewed and she wished to proceed.  Surgical consent had been signed. She was given 2 g of IV Ancef as preoperative antibiotic prophylaxis. Regional block was performed by Anesthesia in preoperative holding.  She was transported to the operating room and placed on the operating room table in supine position with left upper extremity on arm board. General anesthesia was induced by the anesthesiologist.  The left upper extremity was prepped and draped in normal sterile orthopedic fashion. A surgical pause was performed between surgeons, anesthesia, operating staff, and all were in agreement as to the patient, procedure, and site of procedure.  Tourniquet at the proximal aspect of the arm was inflated to 250 mmHg after exsanguination of the limb with an Esmarch bandage. Standard volar [name] approach was used and the superficial and deep portions of the FCR tendon sheath were incised.  Bipolar electrocautery was used to obtain hemostasis.  The FCR and FPL tendons were swept ulnarly to protect the palmar cutaneous branch of median nerve.  The pronator quadratus was released from the radial side of the radius and elevated with the periosteal elevator.  Brachioradialis was released from the radial side of the radius.  Fracture site was easily  identified.  It was cleared of soft tissue interposition.  It did appear to have a extension into the articular surface.  Reduction was performed under direct visualization.  C-arm was used in AP and lateral projections to ensure appropriate reduction which was the case.  A narrow plate from the Acumed volar distal radial locking set was selected and secured to the bone using the guide pins.  It was adjusted until appropriate fit was obtained.  A screw was placed in the slotted hole in the shaft of the plate using standard AO drilling  measuring technique.  The distal holes were filled.  The first hole filled with the distal ulnar hole in which a nonlocking screw was placed to bring the bone up to the plate.  Remaining holes were filled with smooth locking pegs with the exception of the radial styloid holes which were filled with locking screws.  The nonlocking screw was then exchanged for locking peg.  The remaining 2 holes in the shaft of the plate were then filled again using standard AO drilling measuring technique.  C-arm was used in AP, lateral, and oblique projections to ensure appropriate reduction and position of hardware which was the case.  There was no intra-articular penetration.  The wound was copiously irrigated with sterile saline.  Pronator quadratus was repaired back over top of the plate using 3-0 Vicryl in a figure-of-eight fashion.  A single inverted interrupted 3-0 suture was placed in the subcutaneous tissues and the skin was closed with 3-0 nylon in a horizontal mattress fashion.  The wound was dressed with sterile Xeroform, 4x4s, and wrapped with Kerlix. A volar splint was placed and wrapped with Kerlix and Ace bandage. Tourniquet was deflated at 40 minutes.  The fingertips were pink with brisk capillary refill after deflation of tourniquet.  Operative drapes were broken down and the patient was awoken from anesthesia safely.  She was transferred back to stretcher and taken to PACU in stable condition. I will see her back in the office in 1 week for postoperative followup. I will give her Norco 5/325, 1-2 p.o. q.6 hours p.r.n. pain, dispensed #40.     Betha Loa, MD     KK/MEDQ  D:  07/15/2012  T:  07/16/2012  Job:  [id number]

## 2012-09-04 ENCOUNTER — Encounter: Payer: Self-pay | Admitting: Cardiovascular Disease

## 2013-03-04 ENCOUNTER — Encounter: Payer: Self-pay | Admitting: Cardiovascular Disease

## 2013-03-04 ENCOUNTER — Ambulatory Visit (INDEPENDENT_AMBULATORY_CARE_PROVIDER_SITE_OTHER): Payer: Medicare Other | Admitting: Cardiovascular Disease

## 2013-03-04 VITALS — BP 130/68 | HR 90 | Ht 65.0 in | Wt 115.0 lb

## 2013-03-04 DIAGNOSIS — I6350 Cerebral infarction due to unspecified occlusion or stenosis of unspecified cerebral artery: Secondary | ICD-10-CM

## 2013-03-04 DIAGNOSIS — I639 Cerebral infarction, unspecified: Secondary | ICD-10-CM

## 2013-03-04 NOTE — Patient Instructions (Addendum)
Your physician wants you to follow-up in: 1 YEAR with Dr Cooper.  You will receive a reminder letter in the mail two months in advance. If you don't receive a letter, please call our office to schedule the follow-up appointment.  Your physician recommends that you continue on your current medications as directed. Please refer to the Current Medication list given to you today.  

## 2013-03-04 NOTE — Progress Notes (Signed)
   HPI:  79 year-old woman presenting for cardiac follow-up evaluation. She has been previously healthy without overt cardiac disease, HTN, or dyslipidemia. She had a stroke in 2012 and has mild residual aphasia, only when she is tired. She denies palpitations, chest pain, edema, orthopnea, or PND. She fell and broke her wrist since I saw her last year. She required surgery and did not have any cardiac-related problems.  Outpatient Encounter Prescriptions as of 03/04/2013  Medication Sig Dispense Refill  . aspirin 325 MG tablet Take 325 mg by mouth daily.      . Calcium Carbonate-Vitamin D (RA CALCIUM PLUS VITAMIN D) 600-400 MG-UNIT per tablet Take 1 tablet by mouth 2 (two) times daily.        Marland Kitchen denosumab (PROLIA) 60 MG/ML SOLN injection Inject 60 mg into the skin every 6 (six) months. Administer in upper arm, thigh, or abdomen      . Multiple Vitamin (MULTIVITAMIN) tablet Take 1 tablet by mouth daily.        . simvastatin (ZOCOR) 20 MG tablet Take 20 mg by mouth every evening.      . tolterodine (DETROL LA) 4 MG 24 hr capsule Take 4 mg by mouth at bedtime.      . [DISCONTINUED] Cholecalciferol (VITAMIN D) 1000 UNITS capsule Take 1,000 Units by mouth 2 (two) times daily.      . [DISCONTINUED] HYDROcodone-acetaminophen (NORCO) 5-325 MG per tablet 1-2 tabs po q6 hours prn pain  40 tablet  0   No facility-administered encounter medications on file as of 03/04/2013.    No Known Allergies  Past Medical History  Diagnosis Date  . Osteopenia   . CVA (cerebral infarction)   . Hyperlipemia   . Arthritis    BP 130/68  Pulse 90  Ht 5\' 5"  (1.651 m)  Wt 52.164 kg (115 lb)  BMI 19.14 kg/m2  SpO2 96%  PHYSICAL EXAM: Pt is alert and oriented, pleasant elderly woman in NAD HEENT: normal Neck: JVP - normal, carotids 2+= without bruits Lungs: CTA bilaterally CV: RRR without murmur or gallop Abd: soft, NT, Positive BS, no hepatomegaly Ext: no C/C/E, distal pulses intact and equal Skin: warm/dry no  rash  EKG:  NSR 90 bpm, within normal limits  ASSESSMENT AND PLAN: 1. Hyperlipidemia: LDL 92. Reviewed labs from Dr Pablo Lawrence office. Continue statin drug.  2. Palpitations: resolved.   3. Stroke: good recovery. Continue ASA 325 mg daily.  For follow-up I'll see her back in one year unless problems arise.   Tonny Bollman 03/04/2013 6:44 PM

## 2013-06-03 ENCOUNTER — Other Ambulatory Visit: Payer: Self-pay

## 2013-09-03 ENCOUNTER — Other Ambulatory Visit: Payer: Self-pay

## 2013-09-17 ENCOUNTER — Encounter: Payer: Self-pay | Admitting: Cardiovascular Disease

## 2014-01-27 ENCOUNTER — Ambulatory Visit (INDEPENDENT_AMBULATORY_CARE_PROVIDER_SITE_OTHER): Payer: Medicare Other | Admitting: Cardiovascular Disease

## 2014-01-27 ENCOUNTER — Encounter: Payer: Self-pay | Admitting: Cardiovascular Disease

## 2014-01-27 VITALS — BP 136/62 | HR 96 | Ht 65.0 in | Wt 123.1 lb

## 2014-01-27 DIAGNOSIS — I6350 Cerebral infarction due to unspecified occlusion or stenosis of unspecified cerebral artery: Secondary | ICD-10-CM

## 2014-01-27 DIAGNOSIS — I639 Cerebral infarction, unspecified: Secondary | ICD-10-CM

## 2014-01-27 NOTE — Patient Instructions (Signed)
Your physician recommends that you continue on your current medications as directed. Please refer to the Current Medication list given to you today.  Your physician wants you to follow-up in: 1 year with Dr. Cooper.  You will receive a reminder letter in the mail two months in advance. If you don't receive a letter, please call our office to schedule the follow-up appointment.   

## 2014-01-27 NOTE — Progress Notes (Signed)
    HPI:  80 year-old woman presenting for cardiac follow-up evaluation. She has been previously healthy without overt cardiac disease, HTN, or dyslipidemia. She had a stroke in 2012 and has mild residual aphasia, only when she is tired. She's had bronchitis about one month ago. Her symptoms have been fairly protracted.  She's beginning to feel better. She's had no recent fevers, chills, or productive cough. She denies chest pain or pressure, palpitations, edema, orthopnea, or PND.  Outpatient Encounter Prescriptions as of 01/27/2014  Medication Sig  . aspirin 325 MG tablet Take 325 mg by mouth daily.  . Calcium Carbonate-Vitamin D (RA CALCIUM PLUS VITAMIN D) 600-400 MG-UNIT per tablet Take 1 tablet by mouth 2 (two) times daily.    Marland Kitchen denosumab (PROLIA) 60 MG/ML SOLN injection Inject 60 mg into the skin every 6 (six) months. Administer in upper arm, thigh, or abdomen  . Multiple Vitamin (MULTIVITAMIN) tablet Take 1 tablet by mouth daily.    Marland Kitchen MYRBETRIQ 25 MG TB24 tablet Take 25 mg by mouth daily.   . simvastatin (ZOCOR) 20 MG tablet Take 20 mg by mouth every evening.  . [DISCONTINUED] tolterodine (DETROL LA) 4 MG 24 hr capsule Take 4 mg by mouth at bedtime.    No Known Allergies  Past Medical History  Diagnosis Date  . Osteopenia   . CVA (cerebral infarction)   . Hyperlipemia   . Arthritis    BP 136/62  Pulse 96  Ht 5\' 5"  (1.651 m)  Wt 123 lb 1.9 oz (55.847 kg)  BMI 20.49 kg/m2  PHYSICAL EXAM: Pt is alert and oriented, pleasant elderly woman in NAD HEENT: normal Neck: JVP - normal, carotids 2+= without bruits Lungs: CTA bilaterally CV: RRR with soft systolic ejection murmur at the RUSB Abd: soft, NT, Positive BS, no hepatomegaly Ext: no C/C/E, distal pulses intact and equal Skin: warm/dry no rash  EKG:  NSR 97 beats per minute, left atrial enlargement, otherwise within normal limits.  ASSESSMENT AND PLAN: 1. Hyperlipidemia: Pt on statin drug and treated by Dr Maceo Pro. Will send  for a copy of her labs for review.  2. Stroke: no real residual deficit. Continue ASA 325 mg daily.  For follow-up I'll see her back in one year unless problems arise.   Sherren Mocha 01/27/2014 9:25 AM

## 2014-08-06 ENCOUNTER — Other Ambulatory Visit: Payer: Self-pay | Admitting: Family Medicine

## 2014-08-06 DIAGNOSIS — M25551 Pain in right hip: Secondary | ICD-10-CM

## 2014-08-12 ENCOUNTER — Ambulatory Visit
Admission: RE | Admit: 2014-08-12 | Discharge: 2014-08-12 | Disposition: A | Discharge status: Home / Self Care | Payer: Medicare Other | Source: Ambulatory Visit | Attending: Family Medicine | Admitting: Family Medicine

## 2014-08-12 DIAGNOSIS — M25551 Pain in right hip: Secondary | ICD-10-CM

## 2014-08-13 ENCOUNTER — Ambulatory Visit
Admission: RE | Admit: 2014-08-13 | Discharge: 2014-08-13 | Disposition: A | Discharge status: Home / Self Care | Payer: Medicare Other | Source: Ambulatory Visit | Attending: Family Medicine | Admitting: Family Medicine

## 2014-08-13 DIAGNOSIS — M25551 Pain in right hip: Secondary | ICD-10-CM

## 2014-08-13 MED ORDER — IOHEXOL 180 MG/ML  SOLN: 1.0000 mL | Freq: Once | INTRAMUSCULAR | Status: AC | PRN

## 2014-08-13 MED ORDER — METHYLPREDNISOLONE ACETATE 40 MG/ML INJ SUSP (RADIOLOG
120.0000 mg | Freq: Once | INTRAMUSCULAR | Status: AC
Start: 2014-08-13 — End: 2014-08-13
  Administered 2014-08-13: 120 mg via INTRA_ARTICULAR

## 2014-09-01 NOTE — H&P (Signed)
TOTAL HIP ADMISSION H&P  Patient is admitted for right total hip arthroplasty, anterior approach.  Subjective:  Chief Complaint:   Right hip primary OA / pain  HPI: [name], 80 y.o. female, has a history of pain and functional disability in the right hip(s) due to arthritis and patient has failed non-surgical conservative treatments for greater than 12 weeks to include NSAID's and/or analgesics, corticosteriod injections, supervised PT with diminished ADL's post treatment, use of assistive devices and activity modification.  Onset of symptoms was gradual starting <1 years ago with rapidlly worsening course since that time.The patient noted no past surgery on the right hip(s).  Patient currently rates pain in the right hip at 10 out of 10 with activity. Patient has night pain, worsening of pain with activity and weight bearing, trendelenberg gait, pain that interfers with activities of daily living and pain with passive range of motion. Patient has evidence of periarticular osteophytes and joint space narrowing by imaging studies. This condition presents safety issues increasing the risk of falls.  There is no current active infection.  Risks, benefits and expectations were discussed with the patient.  Risks including but not limited to the risk of anesthesia, blood clots, nerve damage, blood vessel damage, failure of the prosthesis, infection and up to and including death.  Patient understand the risks, benefits and expectations and wishes to proceed with surgery.    PCP: Abigail Miyamoto, MD  D/C Plans:      SNF  [facility])  Post-op Meds:       No Rx given  Tranexamic Acid:      To be given - topically (previous 'little' stroke)  Decadron:      Is to be given  FYI:     ASA post-op  Norco post-op   Patient Active Problem List   Diagnosis Date Noted  . Stroke 03/07/2012  . Edema 02/28/2011   Past Medical History  Diagnosis Date  . Osteopenia   . CVA (cerebral infarction)   .  Hyperlipemia   . Arthritis     Past Surgical History  Procedure Laterality Date  . Tonsillectomy and adenoidectomy    . Tarsal tunnel surgery    . Ankle fracture surgery      rt  . Great toe arthrodesis, interphalangeal joint      rt    No prescriptions prior to admission   No Known Allergies   History  Substance Use Topics  . Smoking status: Former Smoker    Types: Cigarettes  . Smokeless tobacco: Not on file     Comment: 10-30-1979  . Alcohol Use: Yes     Comment: 1 or less per day    Family History  Problem Relation Age of Onset  . Heart disease Mother      Review of Systems  Constitutional: Negative.   HENT: Negative.   Eyes: Negative.   Respiratory: Negative.   Cardiovascular: Negative.   Genitourinary: Positive for urgency and frequency.  Musculoskeletal: Positive for back pain and joint pain.  Skin: Negative.   Neurological: Negative.   Endo/Heme/Allergies: Negative.   Psychiatric/Behavioral: Negative.     Objective:  Physical Exam  Constitutional: She is oriented to person, place, and time. She appears well-developed and well-nourished.  HENT:  Head: Normocephalic and atraumatic.  Neck: Neck supple. No JVD present. No tracheal deviation present. No thyromegaly present.  Cardiovascular: Normal rate, regular rhythm, normal heart sounds and intact distal pulses.   Respiratory: Effort normal and breath sounds normal.  No stridor. No respiratory distress. She has no wheezes.  GI: Soft. There is no tenderness. There is no guarding.  Musculoskeletal:       Right hip: She exhibits decreased range of motion, decreased strength, tenderness and bony tenderness. She exhibits no swelling, no deformity and no laceration.  Lymphadenopathy:    She has no cervical adenopathy.  Neurological: She is alert and oriented to person, place, and time.  Skin: Skin is warm and dry.  Psychiatric: She has a normal mood and affect.      Labs:  Estimated body mass index is  20.49 kg/(m^2) as calculated from the following:   Height as of 01/27/14: 5\' 5"  (1.651 m).   Weight as of 01/27/14: 55.847 kg (123 lb 1.9 oz).   Imaging Review Plain radiographs demonstrate severe degenerative joint disease of the right hip(s). The bone quality appears to be good for age and reported activity level.  Assessment/Plan:  End stage arthritis, right hip(s)  The patient history, physical examination, clinical judgement of the provider and imaging studies are consistent with end stage degenerative joint disease of the right hip(s) and total hip arthroplasty is deemed medically necessary. The treatment options including medical management, injection therapy, arthroscopy and arthroplasty were discussed at length. The risks and benefits of total hip arthroplasty were presented and reviewed. The risks due to aseptic loosening, infection, stiffness, dislocation/subluxation,  thromboembolic complications and other imponderables were discussed.  The patient acknowledged the explanation, agreed to proceed with the plan and consent was signed. Patient is being admitted for inpatient treatment for surgery, pain control, PT, OT, prophylactic antibiotics, VTE prophylaxis, progressive ambulation and ADL's and discharge planning.The patient is planning to be discharged to skilled nursing facility.      [place] Pugh Norberta Stobaugh   PA-C  09/01/2014, 11:40 AM

## 2014-09-13 NOTE — Patient Instructions (Addendum)
[name]  09/13/2014   Your procedure is scheduled on:  09/21/2014   Come thru the Emergency Room Entrance.   Follow the Signs to [place] at  [facility]      am  Call this number if you have problems the morning of surgery: [phone]   Remember:   Do not eat food or drink liquids after midnight.   Take these medicines the morning of surgery with A SIP OF WATER: none    Do not wear jewelry, make-up or nail polish.  Do not wear lotions, powders, or perfumes.  deodorant.  Do not shave 48 hours prior to surgery.   Do not bring valuables to the hospital.  Contacts, dentures or bridgework may not be worn into surgery.  Leave suitcase in the car. After surgery it may be brought to your room.  For patients admitted to the hospital, checkout time is 11:00 AM the day of  discharge.          Please read over the following fact sheets that you were given: MRSA Information, coughing and deep breathing exercises, leg exercises            [place] - Preparing for Surgery Before surgery, you can play an important role.  Because skin is not sterile, your skin needs to be as free of germs as possible.  You can reduce the number of germs on your skin by washing with CHG (chlorahexidine gluconate) soap before surgery.  CHG is an antiseptic cleaner which kills germs and bonds with the skin to continue killing germs even after washing. Please DO NOT use if you have an allergy to CHG or antibacterial soaps.  If your skin becomes reddened/irritated stop using the CHG and inform your nurse when you arrive at Short Stay. Do not shave (including legs and underarms) for at least 48 hours prior to the first CHG shower.  You may shave your face/neck. Please follow these instructions carefully:  1.  Shower with CHG Soap the night before surgery and the  morning of Surgery.  2.  If you choose to wash your hair, wash your hair first as usual with your  normal  shampoo.  3.  After you shampoo, rinse your  hair and body thoroughly to remove the  shampoo.                           4.  Use CHG as you would any other liquid soap.  You can apply chg directly  to the skin and wash                       Gently with a scrungie or clean washcloth.  5.  Apply the CHG Soap to your body ONLY FROM THE NECK DOWN.   Do not use on face/ open                           Wound or open sores. Avoid contact with eyes, ears mouth and genitals (private parts).                       Wash face,  Genitals (private parts) with your normal soap.             6.  Wash thoroughly, paying special attention to the area where your surgery  will be performed.  7.  Thoroughly rinse your body with warm water from the neck down.  8.  DO NOT shower/wash with your normal soap after using and rinsing off  the CHG Soap.                9.  Pat yourself dry with a clean towel.            10.  Wear clean pajamas.            11.  Place clean sheets on your bed the night of your first shower and do not  sleep with pets. Day of Surgery : Do not apply any lotions/deodorants the morning of surgery.  Please wear clean clothes to the hospital/surgery center.  FAILURE TO FOLLOW THESE INSTRUCTIONS MAY RESULT IN THE CANCELLATION OF YOUR SURGERY PATIENT SIGNATURE_________________________________  NURSE SIGNATURE__________________________________  ________________________________________________________________________  WHAT IS A BLOOD TRANSFUSION? Blood Transfusion Information  A transfusion is the replacement of blood or some of its parts. Blood is made up of multiple cells which provide different functions.  Red blood cells carry oxygen and are used for blood loss replacement.  White blood cells fight against infection.  Platelets control bleeding.  Plasma helps clot blood.  Other blood products are available for specialized needs, such as hemophilia or other clotting disorders. BEFORE THE TRANSFUSION  Who gives blood for transfusions?    Healthy volunteers who are fully evaluated to make sure their blood is safe. This is blood bank blood. Transfusion therapy is the safest it has ever been in the practice of medicine. Before blood is taken from a donor, a complete history is taken to make sure that person has no history of diseases nor engages in risky social behavior (examples are intravenous drug use or sexual activity with multiple partners). The donor's travel history is screened to minimize risk of transmitting infections, such as malaria. The donated blood is tested for signs of infectious diseases, such as HIV and hepatitis. The blood is then tested to be sure it is compatible with you in order to minimize the chance of a transfusion reaction. If you or a relative donates blood, this is often done in anticipation of surgery and is not appropriate for emergency situations. It takes many days to process the donated blood. RISKS AND COMPLICATIONS Although transfusion therapy is very safe and saves many lives, the main dangers of transfusion include:  1. Getting an infectious disease. 2. Developing a transfusion reaction. This is an allergic reaction to something in the blood you were given. Every precaution is taken to prevent this. The decision to have a blood transfusion has been considered carefully by your caregiver before blood is given. Blood is not given unless the benefits outweigh the risks. AFTER THE TRANSFUSION  Right after receiving a blood transfusion, you will usually feel much better and more energetic. This is especially true if your red blood cells have gotten low (anemic). The transfusion raises the level of the red blood cells which carry oxygen, and this usually causes an energy increase.  The nurse administering the transfusion will monitor you carefully for complications. HOME CARE INSTRUCTIONS  No special instructions are needed after a transfusion. You may find your energy is better. Speak with your  caregiver about any limitations on activity for underlying diseases you may have. SEEK MEDICAL CARE IF:   Your condition is not improving after your transfusion.  You develop redness or irritation at the intravenous (IV) site. SEEK IMMEDIATE MEDICAL CARE IF:  Any of  the following symptoms occur over the next 12 hours:  Shaking chills.  You have a temperature by mouth above 102 F (38.9 C), not controlled by medicine.  Chest, back, or muscle pain.  People around you feel you are not acting correctly or are confused.  Shortness of breath or difficulty breathing.  Dizziness and fainting.  You get a rash or develop hives.  You have a decrease in urine output.  Your urine turns a dark color or changes to pink, red, or brown. Any of the following symptoms occur over the next 10 days:  You have a temperature by mouth above 102 F (38.9 C), not controlled by medicine.  Shortness of breath.  Weakness after normal activity.  The white part of the eye turns yellow (jaundice).  You have a decrease in the amount of urine or are urinating less often.  Your urine turns a dark color or changes to pink, red, or brown. Document Released: 10/12/2000 Document Revised: 01/07/2012 Document Reviewed: 05/31/2008 ExitCare Patient Information 2014 [place].  _______________________________________________________________________  Incentive Spirometer  An incentive spirometer is a tool that can help keep your lungs clear and active. This tool measures how well you are filling your lungs with each breath. Taking long deep breaths may help reverse or decrease the chance of developing breathing (pulmonary) problems (especially infection) following:  A long period of time when you are unable to move or be active. BEFORE THE PROCEDURE   If the spirometer includes an indicator to show your best effort, your nurse or respiratory therapist will set it to a desired goal.  If possible, sit  up straight or lean slightly forward. Try not to slouch.  Hold the incentive spirometer in an upright position. INSTRUCTIONS FOR USE  3. Sit on the edge of your bed if possible, or sit up as far as you can in bed or on a chair. 4. Hold the incentive spirometer in an upright position. 5. Breathe out normally. 6. Place the mouthpiece in your mouth and seal your lips tightly around it. 7. Breathe in slowly and as deeply as possible, raising the piston or the ball toward the top of the column. 8. Hold your breath for 3-5 seconds or for as long as possible. Allow the piston or ball to fall to the bottom of the column. 9. Remove the mouthpiece from your mouth and breathe out normally. 10. Rest for a few seconds and repeat Steps 1 through 7 at least 10 times every 1-2 hours when you are awake. Take your time and take a few normal breaths between deep breaths. 11. The spirometer may include an indicator to show your best effort. Use the indicator as a goal to work toward during each repetition. 12. After each set of 10 deep breaths, practice coughing to be sure your lungs are clear. If you have an incision (the cut made at the time of surgery), support your incision when coughing by placing a pillow or rolled up towels firmly against it. Once you are able to get out of bed, walk around indoors and cough well. You may stop using the incentive spirometer when instructed by your caregiver.  RISKS AND COMPLICATIONS  Take your time so you do not get dizzy or light-headed.  If you are in pain, you may need to take or ask for pain medication before doing incentive spirometry. It is harder to take a deep breath if you are having pain. AFTER USE  Rest and breathe slowly and  easily.  It can be helpful to keep track of a log of your progress. Your caregiver can provide you with a simple table to help with this. If you are using the spirometer at home, follow these instructions: [place] IF:   You  are having difficultly using the spirometer.  You have trouble using the spirometer as often as instructed.  Your pain medication is not giving enough relief while using the spirometer.  You develop fever of 100.5 F (38.1 C) or higher. SEEK IMMEDIATE MEDICAL CARE IF:   You cough up bloody sputum that had not been present before.  You develop fever of 102 F (38.9 C) or greater.  You develop worsening pain at or near the incision site. MAKE SURE YOU:   Understand these instructions.  Will watch your condition.  Will get help right away if you are not doing well or get worse. Document Released: 02/25/2007 Document Revised: 01/07/2012 Document Reviewed: 04/28/2007 [facility] Patient Information 2014 [place], [place].   ________________________________________________________________________

## 2014-09-14 ENCOUNTER — Encounter (HOSPITAL_COMMUNITY)
Admission: RE | Admit: 2014-09-14 | Discharge: 2014-09-14 | Disposition: A | Discharge status: Home / Self Care | Payer: Medicare Other | Source: Ambulatory Visit | Attending: Orthopedic Surgery | Admitting: Orthopedic Surgery

## 2014-09-14 ENCOUNTER — Encounter (HOSPITAL_COMMUNITY): Payer: Self-pay | Admitting: *Deleted

## 2014-09-14 ENCOUNTER — Ambulatory Visit (HOSPITAL_COMMUNITY)
Admission: RE | Admit: 2014-09-14 | Discharge: 2014-09-14 | Disposition: A | Discharge status: Home / Self Care | Payer: Medicare Other | Source: Ambulatory Visit | Attending: Orthopedic Surgery | Admitting: Orthopedic Surgery

## 2014-09-14 DIAGNOSIS — M4856XA Collapsed vertebra, not elsewhere classified, lumbar region, initial encounter for fracture: Secondary | ICD-10-CM | POA: Diagnosis not present

## 2014-09-14 DIAGNOSIS — J984 Other disorders of lung: Secondary | ICD-10-CM | POA: Diagnosis not present

## 2014-09-14 DIAGNOSIS — Z01812 Encounter for preprocedural laboratory examination: Secondary | ICD-10-CM | POA: Insufficient documentation

## 2014-09-14 DIAGNOSIS — Z01811 Encounter for preprocedural respiratory examination: Secondary | ICD-10-CM | POA: Diagnosis not present

## 2014-09-14 DIAGNOSIS — Z01818 Encounter for other preprocedural examination: Secondary | ICD-10-CM

## 2014-09-14 LAB — URINALYSIS, ROUTINE W REFLEX MICROSCOPIC
Bilirubin Urine: NEGATIVE
Glucose, UA: NEGATIVE mg/dL
Ketones, ur: NEGATIVE mg/dL
Nitrite: NEGATIVE
Protein, ur: NEGATIVE mg/dL
Specific Gravity, Urine: 1.011 (ref 1.005–1.030)
Urobilinogen, UA: 0.2 mg/dL (ref 0.0–1.0)
pH: 5.5 (ref 5.0–8.0)

## 2014-09-14 LAB — CBC
HCT: 41.2 % (ref 36.0–46.0)
HEMOGLOBIN: 13.1 g/dL (ref 12.0–15.0)
MCH: 28.9 pg (ref 26.0–34.0)
MCHC: 31.8 g/dL (ref 30.0–36.0)
MCV: 90.7 fL (ref 78.0–100.0)
PLATELETS: 293 10*3/uL (ref 150–400)
RBC: 4.54 MIL/uL (ref 3.87–5.11)
RDW: 14.1 % (ref 11.5–15.5)
WBC: 7.1 10*3/uL (ref 4.0–10.5)

## 2014-09-14 LAB — URINE MICROSCOPIC-ADD ON

## 2014-09-14 LAB — BASIC METABOLIC PANEL
ANION GAP: 9 (ref 5–15)
BUN: 10 mg/dL (ref 6–23)
CHLORIDE: 100 meq/L (ref 96–112)
CO2: 28 mEq/L (ref 19–32)
Calcium: 9.7 mg/dL (ref 8.4–10.5)
Creatinine, Ser: 0.49 mg/dL — ABNORMAL LOW (ref 0.50–1.10)
GFR calc non Af Amer: 87 mL/min — ABNORMAL LOW (ref >90)
Glucose, Bld: 79 mg/dL (ref 70–99)
POTASSIUM: 4.8 meq/L (ref 3.7–5.3)
SODIUM: 137 meq/L (ref 137–147)

## 2014-09-14 LAB — ABO/RH: ABO/RH(D): O POS

## 2014-09-14 LAB — APTT: aPTT: 30 seconds (ref 24–37)

## 2014-09-14 LAB — SURGICAL PCR SCREEN
MRSA, PCR: NEGATIVE
Staphylococcus aureus: NEGATIVE

## 2014-09-14 LAB — PROTIME-INR
INR: 1.02 (ref 0.00–1.49)
PROTHROMBIN TIME: 13.5 s (ref 11.6–15.2)

## 2014-09-14 NOTE — Progress Notes (Signed)
Left message for [name] at [place] regarding status of clearance note from Dr Maceo Pro, PCP.  Asked her to send if available.

## 2014-09-14 NOTE — Progress Notes (Signed)
EKG- 01/27/2014 EPIC  LOV with Dr cooper- EPIC- 01/27/2014.

## 2014-09-15 NOTE — Progress Notes (Signed)
Chest xray results faxed to dr Alvan Dame by epic

## 2014-09-20 ENCOUNTER — Encounter (HOSPITAL_COMMUNITY): Payer: Self-pay | Admitting: Anesthesiology

## 2014-09-20 NOTE — Progress Notes (Signed)
CXR done 09/14/2014 faxed via EPIC to Dr Alvan Dame.

## 2014-09-20 NOTE — Anesthesia Preprocedure Evaluation (Addendum)
Anesthesia Evaluation  Patient identified by MRN, date of birth, ID band Patient awake    Reviewed: Allergy & Precautions, H&P , NPO status , Patient's Chart, lab work & pertinent test results  Airway Mallampati: II  TM Distance: >3 FB Neck ROM: Full    Dental no notable dental hx.    Pulmonary former smoker,  breath sounds clear to auscultation  Pulmonary exam normal       Cardiovascular negative cardio ROS  Rhythm:Regular Rate:Normal     Neuro/Psych  Neuromuscular disease CVA negative psych ROS   GI/Hepatic negative GI ROS, Neg liver ROS,   Endo/Other  negative endocrine ROS  Renal/GU negative Renal ROS  negative genitourinary   Musculoskeletal  (+) Arthritis -,   Abdominal   Peds negative pediatric ROS (+)  Hematology negative hematology ROS (+)   Anesthesia Other Findings   Reproductive/Obstetrics negative OB ROS                           Anesthesia Physical Anesthesia Plan  ASA: III  Anesthesia Plan: Spinal   Post-op Pain Management:    Induction: Intravenous  Airway Management Planned:   Additional Equipment:   Intra-op Plan:   Post-operative Plan:   Informed Consent: I have reviewed the patients History and Physical, chart, labs and discussed the procedure including the risks, benefits and alternatives for the proposed anesthesia with the patient or authorized representative who has indicated his/her understanding and acceptance.   Dental advisory given  Plan Discussed with: CRNA  Anesthesia Plan Comments: (Discussed spinal and general.  Discussed risks/benefits of spinal including headache, backache, failure, bleeding, infection, and nerve damage. Patient consents to spinal. Questions answered. Coagulation studies and platelet count acceptable.)      Anesthesia Quick Evaluation

## 2014-09-21 ENCOUNTER — Inpatient Hospital Stay (HOSPITAL_COMMUNITY)
Admission: RE | Admit: 2014-09-21 | Discharge: 2014-09-24 | DRG: 470 | Disposition: A | Discharge status: Skilled Nursing Facility | Payer: Medicare Other | Source: Ambulatory Visit | Attending: Orthopedic Surgery | Admitting: Orthopedic Surgery

## 2014-09-21 ENCOUNTER — Encounter (HOSPITAL_COMMUNITY): Payer: Self-pay | Admitting: *Deleted

## 2014-09-21 ENCOUNTER — Inpatient Hospital Stay (HOSPITAL_COMMUNITY): Payer: Medicare Other | Admitting: Anesthesiology

## 2014-09-21 ENCOUNTER — Encounter (HOSPITAL_COMMUNITY)
Admission: RE | Disposition: A | Discharge status: Skilled Nursing Facility | Payer: Self-pay | Source: Ambulatory Visit | Attending: Orthopedic Surgery

## 2014-09-21 ENCOUNTER — Inpatient Hospital Stay (HOSPITAL_COMMUNITY): Payer: Medicare Other

## 2014-09-21 DIAGNOSIS — M8580 Other specified disorders of bone density and structure, unspecified site: Secondary | ICD-10-CM | POA: Diagnosis present

## 2014-09-21 DIAGNOSIS — Z85828 Personal history of other malignant neoplasm of skin: Secondary | ICD-10-CM

## 2014-09-21 DIAGNOSIS — Z8249 Family history of ischemic heart disease and other diseases of the circulatory system: Secondary | ICD-10-CM

## 2014-09-21 DIAGNOSIS — Z96649 Presence of unspecified artificial hip joint: Secondary | ICD-10-CM

## 2014-09-21 DIAGNOSIS — Z87891 Personal history of nicotine dependence: Secondary | ICD-10-CM

## 2014-09-21 DIAGNOSIS — M1611 Unilateral primary osteoarthritis, right hip: Secondary | ICD-10-CM | POA: Diagnosis present

## 2014-09-21 DIAGNOSIS — E785 Hyperlipidemia, unspecified: Secondary | ICD-10-CM | POA: Diagnosis present

## 2014-09-21 DIAGNOSIS — N3281 Overactive bladder: Secondary | ICD-10-CM | POA: Diagnosis present

## 2014-09-21 DIAGNOSIS — M25551 Pain in right hip: Secondary | ICD-10-CM | POA: Diagnosis present

## 2014-09-21 DIAGNOSIS — Z8673 Personal history of transient ischemic attack (TIA), and cerebral infarction without residual deficits: Secondary | ICD-10-CM | POA: Diagnosis not present

## 2014-09-21 HISTORY — DX: Unspecified fracture of left patella, initial encounter for closed fracture: S82.002A

## 2014-09-21 HISTORY — DX: Malignant (primary) neoplasm, unspecified: C80.1

## 2014-09-21 HISTORY — DX: Overactive bladder: N32.81

## 2014-09-21 HISTORY — PX: TOTAL HIP ARTHROPLASTY: SHX124

## 2014-09-21 LAB — TYPE AND SCREEN
ABO/RH(D): O POS
ANTIBODY SCREEN: NEGATIVE

## 2014-09-21 SURGERY — ARTHROPLASTY, HIP, TOTAL, ANTERIOR APPROACH
Anesthesia: Spinal | Site: Hip | Laterality: Right

## 2014-09-21 MED ORDER — CEFAZOLIN SODIUM-DEXTROSE 2-3 GM-% IV SOLR
INTRAVENOUS | Status: AC
  Filled 2014-09-21: qty 50

## 2014-09-21 MED ORDER — DEXAMETHASONE SODIUM PHOSPHATE 10 MG/ML IJ SOLN
10.0000 mg | Freq: Once | INTRAMUSCULAR | Status: AC
  Administered 2014-09-22: 10 mg via INTRAVENOUS
  Filled 2014-09-21: qty 1

## 2014-09-21 MED ORDER — ONDANSETRON HCL 4 MG/2ML IJ SOLN
4.0000 mg | Freq: Four times a day (QID) | INTRAMUSCULAR | Status: DC | PRN
  Administered 2014-09-22: 4 mg via INTRAVENOUS
  Filled 2014-09-21: qty 2

## 2014-09-21 MED ORDER — LACTATED RINGERS IV SOLN
INTRAVENOUS | Status: DC
  Administered 2014-09-21: 09:00:00 via INTRAVENOUS

## 2014-09-21 MED ORDER — SODIUM CHLORIDE 0.9 % IV SOLN
100.0000 mL/h | INTRAVENOUS | Status: DC
  Administered 2014-09-21 – 2014-09-22 (×2): 100 mL/h via INTRAVENOUS
  Filled 2014-09-21 (×9): qty 1000

## 2014-09-21 MED ORDER — POLYETHYLENE GLYCOL 3350 17 G PO PACK
17.0000 g | PACK | Freq: Two times a day (BID) | ORAL | Status: DC
  Administered 2014-09-21 – 2014-09-23 (×3): 17 g via ORAL

## 2014-09-21 MED ORDER — MENTHOL 3 MG MT LOZG
1.0000 | LOZENGE | OROMUCOSAL | Status: DC | PRN
  Filled 2014-09-21: qty 9

## 2014-09-21 MED ORDER — PROPOFOL 10 MG/ML IV BOLUS
INTRAVENOUS | Status: AC
  Filled 2014-09-21: qty 20

## 2014-09-21 MED ORDER — CEFAZOLIN SODIUM-DEXTROSE 2-3 GM-% IV SOLR
2.0000 g | INTRAVENOUS | Status: AC
  Administered 2014-09-21: 2 g via INTRAVENOUS

## 2014-09-21 MED ORDER — METOCLOPRAMIDE HCL 10 MG PO TABS: 5.0000 mg | ORAL_TABLET | Freq: Three times a day (TID) | ORAL | Status: DC | PRN

## 2014-09-21 MED ORDER — DOCUSATE SODIUM 100 MG PO CAPS
100.0000 mg | ORAL_CAPSULE | Freq: Two times a day (BID) | ORAL | Status: DC
  Administered 2014-09-21 – 2014-09-23 (×5): 100 mg via ORAL

## 2014-09-21 MED ORDER — FERROUS SULFATE 325 (65 FE) MG PO TABS
325.0000 mg | ORAL_TABLET | Freq: Three times a day (TID) | ORAL | Status: DC
  Administered 2014-09-22 – 2014-09-24 (×7): 325 mg via ORAL
  Filled 2014-09-21 (×11): qty 1

## 2014-09-21 MED ORDER — TRANEXAMIC ACID 100 MG/ML IV SOLN
2000.0000 mg | Freq: Once | INTRAVENOUS | Status: DC
  Filled 2014-09-21: qty 20

## 2014-09-21 MED ORDER — METHOCARBAMOL 1000 MG/10ML IJ SOLN
500.0000 mg | Freq: Four times a day (QID) | INTRAVENOUS | Status: DC | PRN
  Administered 2014-09-21: 500 mg via INTRAVENOUS
  Filled 2014-09-21 (×2): qty 5

## 2014-09-21 MED ORDER — BUPIVACAINE HCL (PF) 0.5 % IJ SOLN
INTRAMUSCULAR | Status: AC
  Filled 2014-09-21: qty 30

## 2014-09-21 MED ORDER — FENTANYL CITRATE 0.05 MG/ML IJ SOLN
INTRAMUSCULAR | Status: DC | PRN
  Administered 2014-09-21: 50 ug via INTRAVENOUS

## 2014-09-21 MED ORDER — BISACODYL 10 MG RE SUPP: 10.0000 mg | Freq: Every day | RECTAL | Status: DC | PRN

## 2014-09-21 MED ORDER — ONDANSETRON HCL 4 MG/2ML IJ SOLN
INTRAMUSCULAR | Status: AC
  Filled 2014-09-21: qty 2

## 2014-09-21 MED ORDER — CELECOXIB 200 MG PO CAPS
200.0000 mg | ORAL_CAPSULE | Freq: Two times a day (BID) | ORAL | Status: DC
  Administered 2014-09-21 – 2014-09-23 (×5): 200 mg via ORAL
  Filled 2014-09-21 (×7): qty 1

## 2014-09-21 MED ORDER — DEXAMETHASONE SODIUM PHOSPHATE 10 MG/ML IJ SOLN
INTRAMUSCULAR | Status: AC
  Filled 2014-09-21: qty 1

## 2014-09-21 MED ORDER — HYDROCODONE-ACETAMINOPHEN 7.5-325 MG PO TABS
1.0000 | ORAL_TABLET | ORAL | Status: DC
  Administered 2014-09-21: 2 via ORAL
  Administered 2014-09-21 – 2014-09-22 (×3): 1 via ORAL
  Administered 2014-09-22: 2 via ORAL
  Administered 2014-09-23 – 2014-09-24 (×4): 1 via ORAL
  Filled 2014-09-21: qty 1
  Filled 2014-09-21: qty 2
  Filled 2014-09-21 (×2): qty 1
  Filled 2014-09-21: qty 2
  Filled 2014-09-21: qty 1
  Filled 2014-09-21: qty 2
  Filled 2014-09-21: qty 1
  Filled 2014-09-21: qty 2
  Filled 2014-09-21: qty 1

## 2014-09-21 MED ORDER — DEXAMETHASONE SODIUM PHOSPHATE 10 MG/ML IJ SOLN
INTRAMUSCULAR | Status: DC | PRN
  Administered 2014-09-21: 10 mg via INTRAVENOUS

## 2014-09-21 MED ORDER — PROPOFOL INFUSION 10 MG/ML OPTIME
INTRAVENOUS | Status: DC | PRN
  Administered 2014-09-21: 50 ug/kg/min via INTRAVENOUS

## 2014-09-21 MED ORDER — PHENYLEPHRINE HCL 10 MG/ML IJ SOLN
INTRAMUSCULAR | Status: AC
  Filled 2014-09-21: qty 1

## 2014-09-21 MED ORDER — ASPIRIN EC 325 MG PO TBEC
325.0000 mg | DELAYED_RELEASE_TABLET | Freq: Two times a day (BID) | ORAL | Status: DC
  Administered 2014-09-22 – 2014-09-24 (×5): 325 mg via ORAL
  Filled 2014-09-21 (×7): qty 1

## 2014-09-21 MED ORDER — MAGNESIUM CITRATE PO SOLN: 1.0000 | Freq: Once | ORAL | Status: AC | PRN

## 2014-09-21 MED ORDER — BUPIVACAINE HCL (PF) 0.5 % IJ SOLN
INTRAMUSCULAR | Status: DC | PRN
  Administered 2014-09-21: 3 mL

## 2014-09-21 MED ORDER — ALUM & MAG HYDROXIDE-SIMETH 200-200-20 MG/5ML PO SUSP: 30.0000 mL | ORAL | Status: DC | PRN

## 2014-09-21 MED ORDER — LACTATED RINGERS IV SOLN
INTRAVENOUS | Status: DC | PRN
  Administered 2014-09-21 (×2): via INTRAVENOUS

## 2014-09-21 MED ORDER — PROPOFOL 10 MG/ML IV BOLUS
INTRAVENOUS | Status: DC | PRN
  Administered 2014-09-21 (×3): 20 mg via INTRAVENOUS
  Administered 2014-09-21: 10 mg via INTRAVENOUS
  Administered 2014-09-21 (×3): 20 mg via INTRAVENOUS

## 2014-09-21 MED ORDER — CEFAZOLIN SODIUM-DEXTROSE 2-3 GM-% IV SOLR
2.0000 g | Freq: Four times a day (QID) | INTRAVENOUS | Status: AC
  Administered 2014-09-21 (×2): 2 g via INTRAVENOUS
  Filled 2014-09-21 (×2): qty 50

## 2014-09-21 MED ORDER — SIMVASTATIN 20 MG PO TABS
20.0000 mg | ORAL_TABLET | Freq: Every evening | ORAL | Status: DC
  Administered 2014-09-21 – 2014-09-23 (×2): 20 mg via ORAL
  Filled 2014-09-21 (×4): qty 1

## 2014-09-21 MED ORDER — METHOCARBAMOL 500 MG PO TABS
500.0000 mg | ORAL_TABLET | Freq: Four times a day (QID) | ORAL | Status: DC | PRN
  Administered 2014-09-21 – 2014-09-24 (×3): 500 mg via ORAL
  Filled 2014-09-21 (×3): qty 1

## 2014-09-21 MED ORDER — ONDANSETRON HCL 4 MG PO TABS: 4.0000 mg | ORAL_TABLET | Freq: Four times a day (QID) | ORAL | Status: DC | PRN

## 2014-09-21 MED ORDER — ONDANSETRON HCL 4 MG/2ML IJ SOLN
INTRAMUSCULAR | Status: DC | PRN
  Administered 2014-09-21: 4 mg via INTRAVENOUS

## 2014-09-21 MED ORDER — PHENOL 1.4 % MT LIQD: 1.0000 | OROMUCOSAL | Status: DC | PRN

## 2014-09-21 MED ORDER — PHENYLEPHRINE HCL 10 MG/ML IJ SOLN
INTRAMUSCULAR | Status: DC | PRN
  Administered 2014-09-21: 40 ug via INTRAVENOUS

## 2014-09-21 MED ORDER — CHLORHEXIDINE GLUCONATE 4 % EX LIQD
60.0000 mL | Freq: Once | CUTANEOUS | Status: DC
Start: 2014-09-21 — End: 2014-09-21

## 2014-09-21 MED ORDER — FENTANYL CITRATE 0.05 MG/ML IJ SOLN: 25.0000 ug | INTRAMUSCULAR | Status: DC | PRN

## 2014-09-21 MED ORDER — SODIUM CHLORIDE 0.9 % IR SOLN
Status: DC | PRN
  Administered 2014-09-21: 1000 mL

## 2014-09-21 MED ORDER — HYDROMORPHONE HCL 1 MG/ML IJ SOLN: 0.5000 mg | INTRAMUSCULAR | Status: DC | PRN

## 2014-09-21 MED ORDER — DEXAMETHASONE SODIUM PHOSPHATE 10 MG/ML IJ SOLN: 10.0000 mg | Freq: Once | INTRAMUSCULAR | Status: DC

## 2014-09-21 MED ORDER — FENTANYL CITRATE 0.05 MG/ML IJ SOLN
INTRAMUSCULAR | Status: AC
  Filled 2014-09-21: qty 2

## 2014-09-21 MED ORDER — METOCLOPRAMIDE HCL 5 MG/ML IJ SOLN: 5.0000 mg | Freq: Three times a day (TID) | INTRAMUSCULAR | Status: DC | PRN

## 2014-09-21 SURGICAL SUPPLY — 41 items
ADH SKN CLS APL DERMABOND .7 (GAUZE/BANDAGES/DRESSINGS) ×1
BAG SPEC THK2 15X12 ZIP CLS (MISCELLANEOUS)
BAG ZIPLOCK 12X15 (MISCELLANEOUS) IMPLANT
CAPT HIP PF MOP ×1 IMPLANT
COVER PERINEAL POST (MISCELLANEOUS) ×2 IMPLANT
DERMABOND ADVANCED (GAUZE/BANDAGES/DRESSINGS) ×1
DERMABOND ADVANCED .7 DNX12 (GAUZE/BANDAGES/DRESSINGS) IMPLANT
DRAPE C-ARM 42X120 X-RAY (DRAPES) ×2 IMPLANT
DRAPE STERI IOBAN 125X83 (DRAPES) ×2 IMPLANT
DRAPE U-SHAPE 47X51 STRL (DRAPES) ×6 IMPLANT
DRSG AQUACEL AG ADV 3.5X10 (GAUZE/BANDAGES/DRESSINGS) ×2 IMPLANT
DURAPREP 26ML APPLICATOR (WOUND CARE) ×2 IMPLANT
ELECT BLADE TIP CTD 4 INCH (ELECTRODE) ×2 IMPLANT
ELECT PENCIL ROCKER SW 15FT (MISCELLANEOUS) IMPLANT
ELECT REM PT RETURN 15FT ADLT (MISCELLANEOUS) IMPLANT
ELECT REM PT RETURN 9FT ADLT (ELECTROSURGICAL) ×2
ELECTRODE REM PT RTRN 9FT ADLT (ELECTROSURGICAL) ×1 IMPLANT
FACESHIELD WRAPAROUND (MASK) ×8 IMPLANT
FACESHIELD WRAPAROUND OR TEAM (MASK) ×4 IMPLANT
GLOVE BIOGEL PI IND STRL 7.5 (GLOVE) ×1 IMPLANT
GLOVE BIOGEL PI IND STRL 8.5 (GLOVE) ×1 IMPLANT
GLOVE BIOGEL PI INDICATOR 7.5 (GLOVE) ×1
GLOVE BIOGEL PI INDICATOR 8.5 (GLOVE) ×1
GLOVE ECLIPSE 8.0 STRL XLNG CF (GLOVE) ×4 IMPLANT
GLOVE ORTHO TXT STRL SZ7.5 (GLOVE) ×2 IMPLANT
GOWN SPEC L3 XXLG W/TWL (GOWN DISPOSABLE) ×2 IMPLANT
GOWN STRL REUS W/TWL LRG LVL3 (GOWN DISPOSABLE) ×2 IMPLANT
HOLDER FOLEY CATH W/STRAP (MISCELLANEOUS) ×2 IMPLANT
KIT BASIN OR (CUSTOM PROCEDURE TRAY) ×2 IMPLANT
LIQUID BAND (GAUZE/BANDAGES/DRESSINGS) ×2 IMPLANT
PACK TOTAL JOINT (CUSTOM PROCEDURE TRAY) ×2 IMPLANT
SAW OSC TIP CART 19.5X105X1.3 (SAW) ×2 IMPLANT
SUT MNCRL AB 4-0 PS2 18 (SUTURE) ×2 IMPLANT
SUT VIC AB 1 CT1 36 (SUTURE) ×6 IMPLANT
SUT VIC AB 2-0 CT1 27 (SUTURE) ×4
SUT VIC AB 2-0 CT1 TAPERPNT 27 (SUTURE) ×2 IMPLANT
SUT VLOC 180 0 24IN GS25 (SUTURE) ×2 IMPLANT
TOWEL OR 17X26 10 PK STRL BLUE (TOWEL DISPOSABLE) ×2 IMPLANT
TOWEL OR NON WOVEN STRL DISP B (DISPOSABLE) IMPLANT
TRAY FOLEY CATH 14FRSI W/METER (CATHETERS) ×2 IMPLANT
WATER STERILE IRR 1500ML POUR (IV SOLUTION) ×2 IMPLANT

## 2014-09-21 NOTE — Plan of Care (Signed)
Problem: Consults Goal: Diagnosis- Total Joint Replacement Left anterior hip  Problem: Phase I Progression Outcomes Goal: CMS/Neurovascular status WDL Outcome: Completed/Met Date Met:  09/21/14 Goal: Pain controlled with appropriate interventions Outcome: Progressing Goal: Dangle or out of bed evening of surgery Outcome: Progressing Goal: Hemodynamically stable Outcome: Completed/Met Date Met:  09/21/14

## 2014-09-21 NOTE — Op Note (Signed)
NAME:  [name]                ACCOUNT NO.: [id number]      MEDICAL RECORD NO.: [id number]      FACILITY:  [facility]      PHYSICIAN:  Paralee Cancel D  DATE OF BIRTH:  01-13-1928     DATE OF PROCEDURE:  09/21/2014                                 OPERATIVE REPORT         PREOPERATIVE DIAGNOSIS: Right  hip osteoarthritis.      POSTOPERATIVE DIAGNOSIS:  Right hip osteoarthritis.      PROCEDURE:  Right total hip replacement through an anterior approach   utilizing DePuy THR system, component size 71mm pinnacle cup, a size 32+4 neutral   Altrex liner, a size 7 standard Tri Lock stem with a 32+1 Articuleze metal head ball.      SURGEON:  Pietro Cassis. Alvan Dame, M.D.      ASSISTANT:  Danae Orleans, PA-C     ANESTHESIA:  Spinal.      SPECIMENS:  None.      COMPLICATIONS:  None.      BLOOD LOSS:  200 cc     DRAINS:  None.      INDICATION OF THE PROCEDURE:  [name] is a 80 y.o. female who had   presented to office for evaluation of right hip pain.  Radiographs revealed   progressive degenerative changes with bone-on-bone   articulation to the  hip joint.  The patient had painful limited range of   motion significantly affecting their overall quality of life.  The patient was failing to    respond to conservative measures, and at this point was ready   to proceed with more definitive measures.  The patient has noted progressive   degenerative changes in his hip, progressive problems and dysfunction   with regarding the hip prior to surgery.  Consent was obtained for   benefit of pain relief.  Specific risk of infection, DVT, component   failure, dislocation, need for revision surgery, as well discussion of   the anterior versus posterior approach were reviewed.  Consent was   obtained for benefit of anterior pain relief through an anterior   approach.      PROCEDURE IN DETAIL:  The patient was brought to operative theater.   Once adequate anesthesia,  preoperative antibiotics, 2gm of Ancef administered.   The patient was positioned supine on the OSI Hanna table.  Once adequate   padding of boney process was carried out, we had predraped out the hip, and  used fluoroscopy to confirm orientation of the pelvis and position.      The right hip was then prepped and draped from proximal iliac crest to   mid thigh with shower curtain technique.      Time-out was performed identifying the patient, planned procedure, and   extremity.     An incision was then made 2 cm distal and lateral to the   anterior superior iliac spine extending over the orientation of the   tensor fascia lata muscle and sharp dissection was carried down to the   fascia of the muscle and protractor placed in the soft tissues.      The fascia was then incised.  The muscle belly was identified and swept   laterally  and retractor placed along the superior neck.  Following   cauterization of the circumflex vessels and removing some pericapsular   fat, a second cobra retractor was placed on the inferior neck.  A third   retractor was placed on the anterior acetabulum after elevating the   anterior rectus.  A L-capsulotomy was along the line of the   superior neck to the trochanteric fossa, then extended proximally and   distally.  Tag sutures were placed and the retractors were then placed   intracapsular.  We then identified the trochanteric fossa and   orientation of my neck cut, confirmed this radiographically   and then made a neck osteotomy with the femur on traction.  The femoral   head was removed without difficulty or complication.  Traction was let   off and retractors were placed posterior and anterior around the   acetabulum.      The labrum and foveal tissue were debrided.  I began reaming with a 43mm   reamer and reamed up to 66mm reamer with good bony bed preparation and a 39mm   cup was chosen.  The final 1mm Pinnacle cup was then impacted under  fluoroscopy  to confirm the depth of penetration and orientation with respect to   abduction.  A screw was placed followed by the hole eliminator.  The final   32+4 neutral Altrex liner was impacted with good visualized rim fit.  The cup was positioned anatomically within the acetabular portion of the pelvis.      At this point, the femur was rolled at 80 degrees.  Further capsule was   released off the inferior aspect of the femoral neck.  I then   released the superior capsule proximally.  The hook was placed laterally   along the femur and elevated manually and held in position with the bed   hook.  The leg was then extended and adducted with the leg rolled to 100   degrees of external rotation.  Once the proximal femur was fully   exposed, I used a box osteotome to set orientation.  I then began   broaching with the starting chili pepper broach and passed this by hand and then broached up to 7.  With the 7 broach in place I chose a standard neck and did a trial reduction.  The offset was appropriate, leg lengths   appeared to be equal, confirmed radiographically.   Given these findings, I went ahead and dislocated the hip, repositioned all   retractors and positioned the right hip in the extended and abducted position.  The final 7 standard Tri Lock stem was   chosen and it was impacted down to the level of neck cut.  Based on this   and the trial reduction, a 32+1 Articuleze metal head ball was chosen and   impacted onto a clean and dry trunnion, and the hip was reduced.  The   hip had been irrigated throughout the case again at this point.  I did   reapproximate the superior capsular leaflet to the anterior leaflet   using #1 Vicryl.  The fascia of the   tensor fascia lata muscle was then reapproximated using #1 Vicryl and #0 V-lock sutures.  The   remaining wound was closed with 2-0 Vicryl and running 4-0 Monocryl.   The hip was cleaned, dried, and dressed sterilely using Dermabond and    Aquacel dressing.  She was then brought   to recovery room in  stable condition tolerating the procedure well.    Danae Orleans, PA-C was present for the entirety of the case involved from   preoperative positioning, perioperative retractor management, general   facilitation of the case, as well as primary wound closure as assistant.            Pietro Cassis Alvan Dame, M.D.        09/21/2014 8:19 AM

## 2014-09-21 NOTE — Progress Notes (Signed)
Clinical Social Work Department BRIEF PSYCHOSOCIAL ASSESSMENT 09/21/2014  Patient:  [name],[name]     Account Number:  [id number]     Admit date:  09/21/2014  Clinical Social Worker:  HAIDINGER,JAMIE, LCSW  Date/Time:  09/21/2014 02:54 PM  Referred by:  Physician  Date Referred:  09/21/2014 Referred for  SNF Placement   Other Referral:   Interview type:  Patient Other interview type:    PSYCHOSOCIAL DATA Living Status:  ALONE Admitted from facility:   Level of care:   Primary support name:  Cynthia Sahr Primary support relationship to patient:  CHILD, ADULT Degree of support available:   supportive    CURRENT CONCERNS Current Concerns  Post-Acute Placement   Other Concerns:    SOCIAL WORK ASSESSMENT / PLAN Pt is an 80 yr old female living at home prior to hospitalization. CSW met with pt / family to assist with d/c planning. This is a planned admission. Pt has made prior arrangements to have ST Rehab at [facility] following hospital d/c. CSW has contacted SNF and d/c plans have been confirmed pending [employer] prior authorization. CSW will request authorization once PT recommendations are available.   Assessment/plan status:  Psychosocial Support/Ongoing Assessment of Needs Other assessment/ plan:   Information/referral to community resources:   Insurance coverage for SNF and ambulance transport reviewed.    PATIENT'S/FAMILY'S RESPONSE TO PLAN OF CARE: Pt's mood is bright. Her pain is controlled. She is happy surgery is over. Pt is looking forward to having rehab at [facility].    Jamie Haidinger LCSW [phone]      

## 2014-09-21 NOTE — Anesthesia Postprocedure Evaluation (Signed)
  Anesthesia Post-op Note  Patient: [name]  Procedure(s) Performed: Procedure(s) (LRB): RIGHT TOTAL HIP ARTHROPLASTY ANTERIOR APPROACH (Right)  Patient Location: PACU  Anesthesia Type: Spinal  Level of Consciousness: awake and alert   Airway and Oxygen Therapy: Patient Spontanous Breathing  Post-op Pain: mild  Post-op Assessment: Post-op Vital signs reviewed, Patient's Cardiovascular Status Stable, Respiratory Function Stable, Patent Airway and No signs of Nausea or vomiting  Last Vitals:  Filed Vitals:   09/21/14 1315  BP: 109/63  Pulse: 81  Temp: 36.6 C  Resp: 16    Post-op Vital Signs: stable   Complications: No apparent anesthesia complications

## 2014-09-21 NOTE — Plan of Care (Signed)
Problem: Phase I Progression Outcomes Goal: Pain controlled with appropriate interventions Outcome: Completed/Met Date Met:  09/21/14 Goal: Dangle or out of bed evening of surgery Outcome: Completed/Met Date Met:  09/21/14 Goal: Initial discharge plan identified Outcome: Completed/Met Date Met:  09/21/14  Problem: Phase II Progression Outcomes Goal: Tolerating diet Outcome: Completed/Met Date Met:  09/21/14     

## 2014-09-21 NOTE — Progress Notes (Signed)
Utilization review completed.  

## 2014-09-21 NOTE — Progress Notes (Signed)
Clinical Social Work Department CLINICAL SOCIAL WORK PLACEMENT NOTE 09/21/2014  Patient:  ADNA, NOFZIGER  Account Number:  [id number] Admit date:  09/21/2014  Clinical Social Worker:  Werner Lean, LCSW  Date/time:  09/21/2014 03:04 PM  Clinical Social Work is seeking post-discharge placement for this patient at the following level of care:   SKILLED NURSING   (*CSW will update this form in Epic as items are completed)     Patient/family provided with [place] [facility] Department of Clinical Social Work's list of facilities offering this level of care within the geographic area requested by the patient (or if unable, by the patient's family).  09/21/2014  Patient/family informed of their freedom to choose among providers that offer the needed level of care, that participate in Medicare, Medicaid or managed care program needed by the patient, have an available bed and are willing to accept the patient.    Patient/family informed of [facility]' ownership interest in [facility], as well as of the fact that they are under no obligation to receive care at this facility.  PASARR submitted to EDS on 09/21/2014 PASARR number received on 09/21/2014  FL2 transmitted to all facilities in geographic area requested by pt/family on  09/21/2014 FL2 transmitted to all facilities within larger geographic area on   Patient informed that his/her managed care company has contracts with or will negotiate with  certain facilities, including the following:     Patient/family informed of bed offers received:  09/21/2014 Patient chooses bed at [facility] Physician recommends and patient chooses bed at    Patient to be transferred to  on   Patient to be transferred to facility by  Patient and family notified of transfer on  Name of family member notified:    The following physician request were entered in Epic:   Additional Comments:  Werner Lean LCSW [phone]

## 2014-09-21 NOTE — Transfer of Care (Signed)
Immediate Anesthesia Transfer of Care Note  Patient: [name]  Procedure(s) Performed: Procedure(s): RIGHT TOTAL HIP ARTHROPLASTY ANTERIOR APPROACH (Right)  Patient Location: PACU  Anesthesia Type:Regional  Level of Consciousness: awake, alert  and oriented  Airway & Oxygen Therapy: Patient Spontanous Breathing and Patient connected to face mask oxygen  Post-op Assessment: Report given to PACU RN and Post -op Vital signs reviewed and stable  Post vital signs: Reviewed and stable  Complications: No apparent anesthesia complications

## 2014-09-21 NOTE — Anesthesia Procedure Notes (Signed)
Spinal Patient location during procedure: OR Start time: 09/21/2014 7:05 AM End time: 09/21/2014 7:10 AM Staffing Resident/CRNA: Anne Fu Performed by: resident/CRNA  Preanesthetic Checklist Completed: patient identified, site marked, surgical consent, pre-op evaluation, timeout performed, IV checked, risks and benefits discussed and monitors and equipment checked Spinal Block Patient position: sitting Prep: Betadine Patient monitoring: heart rate, continuous pulse ox and blood pressure Approach: right paramedian Location: L3-4 Injection technique: single-shot Needle Needle type: Spinocan  Needle gauge: 22 G Needle length: 9 cm Assessment Sensory level: T4 Additional Notes Expiration date of kit checked and confirmed. Patient tolerated procedure well, without complications. X 2 attempt with noted clear CSF return easy administration with noted loss of motor and sensory on exam.

## 2014-09-21 NOTE — Interval H&P Note (Signed)
History and Physical Interval Note:  09/21/2014 6:30 AM  [name]  has presented today for surgery, with the diagnosis of Right hip Osteoarthritis  The various methods of treatment have been discussed with the patient and family. After consideration of risks, benefits and other options for treatment, the patient has consented to  Procedure(s): RIGHT TOTAL HIP ARTHROPLASTY ANTERIOR APPROACH (Right) as a surgical intervention .  The patient's history has been reviewed, patient examined, no change in status, stable for surgery.  I have reviewed the patient's chart and labs.  Questions were answered to the patient's satisfaction.     Mauri Pole

## 2014-09-21 NOTE — Evaluation (Signed)
Physical Therapy Evaluation Patient Details Name: [name] MRN: [id number] DOB: 02-Sep-1928 Today's Date: 09/21/2014   History of Present Illness  Pt is an 80 year old female s/p R direct anterior THA.  Clinical Impression  Pt is s/p R THA resulting in the deficits listed below (see PT Problem List).  Pt will benefit from skilled PT to increase their independence and safety with mobility to allow discharge to the venue listed below.  Pt mobilizing well POD #0 and plans to d/c to [facility].       Follow Up Recommendations SNF    Equipment Recommendations  None recommended by PT    Recommendations for Other Services       Precautions / Restrictions Precautions Precautions: None Restrictions Other Position/Activity Restrictions: WBAT      Mobility  Bed Mobility Overal bed mobility: Needs Assistance Bed Mobility: Supine to Sit     Supine to sit: Min guard     General bed mobility comments: verbal cues for technique  Transfers Overall transfer level: Needs assistance Equipment used: Rolling walker (2 wheeled) Transfers: Sit to/from Stand Sit to Stand: Min guard         General transfer comment: good hand placement, increased time however no physical assist required  Ambulation/Gait Ambulation/Gait assistance: Min guard Ambulation Distance (Feet): 50 Feet Assistive device: Rolling walker (2 wheeled) Gait Pattern/deviations: Step-to pattern;Step-through pattern;Antalgic Gait velocity: decr   General Gait Details: verbal cues for step to pattern and smaller steps for pain control as pt reported 6/10 pain, verbal cues for posture and RW positioning  Stairs            Wheelchair Mobility    Modified Rankin (Stroke Patients Only)       Balance                                             Pertinent Vitals/Pain Pain Assessment: 0-10 Pain Score: 5  Pain Location: R hip Pain Descriptors / Indicators: Sore;Aching Pain  Intervention(s): Monitored during session;Limited activity within patient's tolerance;Repositioned;Ice applied;Premedicated before session    Home Living Family/patient expects to be discharged to:: Skilled nursing facility Living Arrangements: Alone                    Prior Function Level of Independence: Independent               Hand Dominance        Extremity/Trunk Assessment               Lower Extremity Assessment: RLE deficits/detail RLE Deficits / Details: decreased AROM against gravity due to pain       Communication   Communication: No difficulties  Cognition Arousal/Alertness: Awake/alert Behavior During Therapy: WFL for tasks assessed/performed Overall Cognitive Status: Within Functional Limits for tasks assessed                      General Comments      Exercises        Assessment/Plan    PT Assessment Patient needs continued PT services  PT Diagnosis Acute pain;Difficulty walking   PT Problem List Decreased strength;Decreased activity tolerance;Decreased mobility;Pain  PT Treatment Interventions Functional mobility training;Gait training;DME instruction;Patient/family education;Therapeutic activities;Therapeutic exercise   PT Goals (Current goals can be found in the Care Plan section) Acute Rehab PT Goals PT Goal Formulation:  With patient Time For Goal Achievement: 09/25/14 Potential to Achieve Goals: Good    Frequency 7X/week   Barriers to discharge        Co-evaluation               End of Session   Activity Tolerance: Patient tolerated treatment well Patient left: in chair;with call bell/phone within reach;with family/visitor present           Time: 1540-0867 PT Time Calculation (min) (ACUTE ONLY): 19 min   Charges:   PT Evaluation $Initial PT Evaluation Tier I: 1 Procedure PT Treatments $Gait Training: 8-22 mins   PT G Codes:          Rochanda Harpham,KATHrine E 09/21/2014, 4:48 PM Carmelia Bake,  PT, DPT 09/21/2014 Pager: [phone]

## 2014-09-22 ENCOUNTER — Encounter (HOSPITAL_COMMUNITY): Payer: Self-pay | Admitting: Orthopedic Surgery

## 2014-09-22 LAB — CBC
HCT: 30.2 % — ABNORMAL LOW (ref 36.0–46.0)
Hemoglobin: 9.6 g/dL — ABNORMAL LOW (ref 12.0–15.0)
MCH: 28.7 pg (ref 26.0–34.0)
MCHC: 31.8 g/dL (ref 30.0–36.0)
MCV: 90.4 fL (ref 78.0–100.0)
Platelets: 203 10*3/uL (ref 150–400)
RBC: 3.34 MIL/uL — ABNORMAL LOW (ref 3.87–5.11)
RDW: 13.9 % (ref 11.5–15.5)
WBC: 9.5 10*3/uL (ref 4.0–10.5)

## 2014-09-22 LAB — BASIC METABOLIC PANEL
Anion gap: 8 (ref 5–15)
BUN: 7 mg/dL (ref 6–23)
CALCIUM: 7.7 mg/dL — AB (ref 8.4–10.5)
CO2: 25 mEq/L (ref 19–32)
Chloride: 104 mEq/L (ref 96–112)
Creatinine, Ser: 0.53 mg/dL (ref 0.50–1.10)
GFR, EST NON AFRICAN AMERICAN: 85 mL/min — AB (ref >90)
Glucose, Bld: 114 mg/dL — ABNORMAL HIGH (ref 70–99)
POTASSIUM: 4.5 meq/L (ref 3.7–5.3)
SODIUM: 137 meq/L (ref 137–147)

## 2014-09-22 NOTE — Evaluation (Signed)
Occupational Therapy Evaluation Patient Details Name: [name] MRN: [id number] DOB: 1928-01-01 Today's Date: 09/22/2014    History of Present Illness Pt is an 80 year old female s/p R direct anterior THA.   Clinical Impression   Pt was admitted for the above surgery.  She lives alone and will benefit from skilled OT in acute and continued OT at [facility].  Goals in acute are for supervision level.  Pt is currently min guard to min A.    Follow Up Recommendations  SNF    Equipment Recommendations  3 in 1 bedside comode    Recommendations for Other Services       Precautions / Restrictions Precautions Precautions: None Restrictions Weight Bearing Restrictions: No      Mobility Bed Mobility   Bed Mobility: Supine to Sit     Supine to sit: Min guard     General bed mobility comments: HOB raised  Transfers   Equipment used: Rolling walker (2 wheeled) Transfers: Sit to/from Stand Sit to Stand: Min guard         General transfer comment: for safety    Balance                                            ADL Overall ADL's : Needs assistance/impaired     Grooming: Oral care;Set up;Sitting   Upper Body Bathing: Set up;Sitting   Lower Body Bathing: Minimal assistance;Sit to/from stand   Upper Body Dressing : Minimal assistance;Sitting (iv)   Lower Body Dressing: Minimal assistance;Sit to/from stand;With adaptive equipment   Toilet Transfer: Minimal assistance;Stand-pivot;BSC   Toileting- Clothing Manipulation and Hygiene: Min guard;Sit to/from stand         General ADL Comments: performed ADL from EOB, transferred to 3:1 then to chair.  Educated on sock aide and pt used this with min A.     Vision                     Perception     Praxis      Pertinent Vitals/Pain Pain Score: 4  Pain Location: R hip Pain Descriptors / Indicators: Sore Pain Intervention(s): Monitored during session;Limited activity within  patient's tolerance;Premedicated before session;Repositioned     Hand Dominance     Extremity/Trunk Assessment Upper Extremity Assessment Upper Extremity Assessment: Overall WFL for tasks assessed           Communication Communication Communication: No difficulties   Cognition Arousal/Alertness: Awake/alert Behavior During Therapy: WFL for tasks assessed/performed Overall Cognitive Status: Within Functional Limits for tasks assessed                     General Comments       Exercises       Shoulder Instructions      Home Living Family/patient expects to be discharged to:: Skilled nursing facility Living Arrangements: Alone                                      Prior Functioning/Environment Level of Independence: Independent             OT Diagnosis: Generalized weakness   OT Problem List: Decreased strength;Decreased knowledge of use of DME or AE;Pain   OT Treatment/Interventions: Self-care/ADL training;DME and/or AE instruction;Patient/family education  OT Goals(Current goals can be found in the care plan section) Acute Rehab OT Goals Patient Stated Goal: rehab then home OT Goal Formulation: With patient Time For Goal Achievement: 09/29/14 Potential to Achieve Goals: Good ADL Goals Pt Will Perform Lower Body Bathing: with supervision;sit to/from stand Pt Will Perform Lower Body Dressing: with supervision;sit to/from stand Pt Will Transfer to Toilet: with supervision;ambulating;bedside commode Pt Will Perform Toileting - Clothing Manipulation and hygiene: with supervision;sit to/from stand  OT Frequency: Min 2X/week   Barriers to D/C:            Co-evaluation              End of Session    Activity Tolerance: Patient tolerated treatment well Patient left: in chair;with call bell/phone within reach   Time: 0912-0947 and 857 - 907 OT Time Calculation (min): 45 min Charges:  OT General Charges $OT Visit: 1  Procedure OT Evaluation $Initial OT Evaluation Tier I: 1 Procedure OT Treatments $Self Care/Home Management : 38-52 mins G-Codes:    Layanna Charo October 22, 2014, 10:05 AM Lesle Chris, OTR/L [phone] 10-22-14

## 2014-09-22 NOTE — Progress Notes (Signed)
Physical Therapy Treatment Patient Details Name: [name] MRN: [id number] DOB: 1928/09/22 Today's Date: 2014/10/16    History of Present Illness Pt is an 80 year old female s/p R direct anterior THA.    PT Comments    Pt progressing well.  Pt tolerated ambulating in hallway and performing LE exercises very well.  Follow Up Recommendations  SNF     Equipment Recommendations  None recommended by PT    Recommendations for Other Services       Precautions / Restrictions Precautions Precautions: None Restrictions Other Position/Activity Restrictions: WBAT    Mobility  Bed Mobility               General bed mobility comments: pt up in recliner on arrival  Transfers Overall transfer level: Needs assistance Equipment used: Rolling walker (2 wheeled) Transfers: Sit to/from [employer] Transfers Sit to Stand: Min guard Stand pivot transfers: Min guard       General transfer comment: does well with hand placement  Ambulation/Gait Ambulation/Gait assistance: Min guard Ambulation Distance (Feet): 160 Feet Assistive device: Rolling walker (2 wheeled) Gait Pattern/deviations: Step-through pattern;Antalgic;Trunk flexed     General Gait Details: verbal cues for step length and posture   Stairs            Wheelchair Mobility    Modified Rankin (Stroke Patients Only)       Balance                                    Cognition Arousal/Alertness: Awake/alert Behavior During Therapy: WFL for tasks assessed/performed Overall Cognitive Status: Within Functional Limits for tasks assessed                      Exercises Total Joint Exercises Ankle Circles/Pumps: AROM;Both;15 reps Quad Sets: AROM;Both;15 reps Towel Squeeze: AROM;Both;15 reps Heel Slides: AAROM;Right;15 reps Hip ABduction/ADduction: AROM;Right;15 reps Straight Leg Raises: AAROM;Right;15 reps Long Arc Quad: AROM;Seated;Right;15 Theatre manager in Standing:  AROM;Seated;Right;15 reps    General Comments        Pertinent Vitals/Pain Pain Assessment: 0-10 Pain Score: 4  Pain Location: R hip and thigh Pain Descriptors / Indicators: Sore Pain Intervention(s): Limited activity within patient's tolerance;Monitored during session;Repositioned;Premedicated before session    Home Living                      Prior Function            PT Goals (current goals can now be found in the care plan section) Progress towards PT goals: Progressing toward goals    Frequency  7X/week    PT Plan Current plan remains appropriate    Co-evaluation             End of Session   Activity Tolerance: Patient tolerated treatment well Patient left: in chair;with call bell/phone within reach;with family/visitor present     Time: 4098-1191 PT Time Calculation (min) (ACUTE ONLY): 32 min  Charges:  $Gait Training: 8-22 mins $Therapeutic Exercise: 8-22 mins                    G Codes:      [name],[name] 10/16/2014, 2:42 PM Carmelia Bake, PT, DPT Oct 16, 2014 Pager: [phone]

## 2014-09-22 NOTE — Care Management Note (Signed)
    Page 1 of 1   09/22/2014     12:44:28 PM CARE MANAGEMENT NOTE 09/22/2014  Patient:  [name], [name]   Account Number:  [id number]  Date Initiated:  09/22/2014  Documentation initiated by:  [facility]  Subjective/Objective Assessment:   adm: RIGHT TOTAL HIP ARTHROPLASTY ANTERIOR APPROACH (Right)     Action/Plan:   discharge planning   Anticipated DC Date:  09/22/2014   Anticipated DC Plan:  [place]  CM consult      Choice offered to / List presented to:             Status of service:  Completed, signed off Medicare Important Message given?   (If response is "NO", the following Medicare IM given date fields will be blank) Date Medicare IM given:   Medicare IM given by:   Date Additional Medicare IM given:   Additional Medicare IM given by:    Discharge Disposition:  [place]  Per UR Regulation:    If discussed at Long Length of Stay Meetings, dates discussed:    Comments:  09/22/14 12:40- CM notes pt to go to SNF for rehab; CSW arranging.  No other CM needs were communicated.  Mariane Masters, BSN, CM [phone].

## 2014-09-22 NOTE — Plan of Care (Signed)
Problem: Phase II Progression Outcomes Goal: Ambulates Outcome: Completed/Met Date Met:  09/22/14 Goal: Discharge plan established Outcome: Completed/Met Date Met:  09/22/14 Goal: Other Phase II Outcomes/Goals Outcome: Completed/Met Date Met:  09/22/14     

## 2014-09-22 NOTE — Progress Notes (Signed)
     Subjective: 1 Day Post-Op Procedure(s) (LRB): RIGHT TOTAL HIP ARTHROPLASTY ANTERIOR APPROACH (Right)   Patient reports pain as mild, pain controlled. Some pain on the lateral aspect of the hip. No events throughout the night. Still would like to consider SNF upon discharge.  Objective:   VITALS:   Filed Vitals:   09/22/14 0619  BP: 107/64  Pulse: 90  Temp: 97.6 F (36.4 C)  Resp: 15    Dorsiflexion/Plantar flexion intact Incision: dressing C/D/I No cellulitis present Compartment soft  LABS  Recent Labs  09/22/14 0431  HGB 9.6*  HCT 30.2*  WBC 9.5  PLT 203     Recent Labs  09/22/14 0431  NA 137  K 4.5  BUN 7  CREATININE 0.53  GLUCOSE 114*     Assessment/Plan: 1 Day Post-Op Procedure(s) (LRB): RIGHT TOTAL HIP ARTHROPLASTY ANTERIOR APPROACH (Right) Foley cath d/c'ed Advance diet Up with therapy D/C IV fluids Discharge to SNF       [name]   PAC  09/22/2014, 10:38 AM

## 2014-09-22 NOTE — Progress Notes (Signed)
Physical Therapy Treatment Note    09/22/14 1500  PT Visit Information  Last PT Received On 09/22/14  Assistance Needed +1  History of Present Illness Pt is an 80 year old female s/p R direct anterior THA.  PT Time Calculation  PT Start Time (ACUTE ONLY) 1453  PT Stop Time (ACUTE ONLY) 1510  PT Time Calculation (min) (ACUTE ONLY) 17 min  Subjective Data  Subjective Pt ambulated in hallway again and then assisted back to bed.  Precautions  Precautions None  Restrictions  Other Position/Activity Restrictions WBAT  Pain Assessment  Pain Assessment 0-10  Pain Score 2  Pain Location R hip and thigh  Pain Descriptors / Indicators Sore  Pain Intervention(s) Repositioned;Monitored during session;Limited activity within patient's tolerance  Cognition  Arousal/Alertness Awake/alert  Behavior During Therapy WFL for tasks assessed/performed  Overall Cognitive Status Within Functional Limits for tasks assessed  Bed Mobility  Overal bed mobility Needs Assistance  Bed Mobility Sit to Supine  Sit to supine Min guard  General bed mobility comments difficulty bringing LEs onto bed however did not required assist  Transfers  Overall transfer level Needs assistance  Equipment used Rolling walker (2 wheeled)  Transfers Sit to/from Stand  Sit to Stand Min guard  Ambulation/Gait  Ambulation/Gait assistance Min guard  Ambulation Distance (Feet) 200 Feet  Assistive device Rolling walker (2 wheeled)  Gait Pattern/deviations Step-through pattern;Antalgic  General Gait Details stiff and antalgic gait beginning ambulation however progressed to smooth pattern  PT - End of Session  Activity Tolerance Patient tolerated treatment well  Patient left in bed;with call bell/phone within reach;with family/visitor present  PT - Assessment/Plan  PT Plan Current plan remains appropriate  PT Frequency (ACUTE ONLY) 7X/week  Follow Up Recommendations SNF  PT equipment None recommended by PT  PT Goal  Progression  Progress towards PT goals Progressing toward goals  PT General Charges  $$ ACUTE PT VISIT 1 Procedure  PT Treatments  $Gait Training 8-22 mins   Carmelia Bake, PT, DPT 09/22/2014 Pager: [phone]

## 2014-09-23 LAB — CBC
HCT: 28.8 % — ABNORMAL LOW (ref 36.0–46.0)
Hemoglobin: 9.3 g/dL — ABNORMAL LOW (ref 12.0–15.0)
MCH: 29.5 pg (ref 26.0–34.0)
MCHC: 32.3 g/dL (ref 30.0–36.0)
MCV: 91.4 fL (ref 78.0–100.0)
PLATELETS: 174 10*3/uL (ref 150–400)
RBC: 3.15 MIL/uL — AB (ref 3.87–5.11)
RDW: 14.3 % (ref 11.5–15.5)
WBC: 11.3 10*3/uL — ABNORMAL HIGH (ref 4.0–10.5)

## 2014-09-23 LAB — BASIC METABOLIC PANEL
Anion gap: 8 (ref 5–15)
BUN: 10 mg/dL (ref 6–23)
CALCIUM: 8.5 mg/dL (ref 8.4–10.5)
CO2: 26 meq/L (ref 19–32)
CREATININE: 0.51 mg/dL (ref 0.50–1.10)
Chloride: 104 mEq/L (ref 96–112)
GFR calc Af Amer: >90 mL/min (ref >90)
GFR calc non Af Amer: 86 mL/min — ABNORMAL LOW (ref >90)
Glucose, Bld: 126 mg/dL — ABNORMAL HIGH (ref 70–99)
Potassium: 4.8 mEq/L (ref 3.7–5.3)
SODIUM: 138 meq/L (ref 137–147)

## 2014-09-23 NOTE — Plan of Care (Signed)
Problem: Phase III Progression Outcomes Goal: Discharge plan remains appropriate-arrangements made Outcome: Completed/Met Date Met:  09/23/14 Goal: Anticoagulant follow-up in place Outcome: Completed/Met Date Met:  09/23/14

## 2014-09-23 NOTE — Progress Notes (Signed)
Physical Therapy Treatment Patient Details Name: [name] MRN: [id number] DOB: 15-Feb-1928 Today's Date: 09/23/2014    History of Present Illness Pt is an 80 year old female s/p R direct anterior THA.    PT Comments    Progressing well with mobility. Plan is for d/c to SNF on tomorrow.  Follow Up Recommendations  SNF     Equipment Recommendations  None recommended by PT    Recommendations for Other Services       Precautions / Restrictions Precautions Precautions: None Restrictions Weight Bearing Restrictions: No Other Position/Activity Restrictions: WBAT    Mobility  Bed Mobility Overal bed mobility: Needs Assistance Bed Mobility: Supine to Sit;Sit to Supine     Supine to sit: Min guard Sit to supine: Min guard   General bed mobility comments: Increased time. close guard for safety  Transfers Overall transfer level: Needs assistance Equipment used: Rolling walker (2 wheeled) Transfers: Sit to/from Stand Sit to Stand: Min guard         General transfer comment: close guard for safety   Ambulation/Gait Ambulation/Gait assistance: Min guard Ambulation Distance (Feet): 215 Feet Assistive device: Rolling walker (2 wheeled) Gait Pattern/deviations: Step-through pattern;Decreased stride length     General Gait Details: slow gait speed. close guard for safety   Stairs            Wheelchair Mobility    Modified Rankin (Stroke Patients Only)       Balance                                    Cognition Arousal/Alertness: Awake/alert Behavior During Therapy: WFL for tasks assessed/performed Overall Cognitive Status: Within Functional Limits for tasks assessed                      Exercises Total Joint Exercises Ankle Circles/Pumps: AROM;Both;15 reps;Supine Quad Sets: AROM;Both;15 reps;Supine Heel Slides: AAROM;Right;15 reps;Supine Hip ABduction/ADduction: AAROM;Right;Supine;15 reps Straight Leg Raises:  AROM;Right;Supine (7 reps) Long Arc Quad: AROM;Right;15 reps;Seated    General Comments        Pertinent Vitals/Pain Pain Assessment: 0-10 Pain Score: 4  Pain Location: R anterior/laterhal thigh Pain Descriptors / Indicators: Sore Pain Intervention(s): Monitored during session;Ice applied    Home Living                      Prior Function            PT Goals (current goals can now be found in the care plan section) Progress towards PT goals: Progressing toward goals    Frequency  7X/week    PT Plan Current plan remains appropriate    Co-evaluation             End of Session   Activity Tolerance: Patient tolerated treatment well Patient left: in bed;with call bell/phone within reach     Time: 0830-0907 PT Time Calculation (min) (ACUTE ONLY): 37 min  Charges:  $Gait Training: 8-22 mins $Therapeutic Exercise: 8-22 mins                    G Codes:      Weston Anna, MPT Pager: [phone]

## 2014-09-23 NOTE — Plan of Care (Signed)
Problem: Phase III Progression Outcomes Goal: Pain controlled on oral analgesia Outcome: Completed/Met Date Met:  09/23/14 Goal: Ambulates Outcome: Completed/Met Date Met:  09/23/14 Goal: Incision clean - minimal/no drainage Outcome: Completed/Met Date Met:  09/23/14 Goal: Discharge plan remains appropriate-arrangements made Outcome: Progressing

## 2014-09-23 NOTE — Progress Notes (Signed)
   Subjective: 2 Days Post-Op Procedure(s) (LRB): RIGHT TOTAL HIP ARTHROPLASTY ANTERIOR APPROACH (Right) Patient reports pain as mild and moderate.   Patient seen in rounds with Dr. Wynelle Link. Patient is well, but has had some minor complaints of pain in the hip, requiring pain medications We will start therapy today.  Plan is to go Skilled nursing facility after hospital stay. Probably tomorrow to SNF  Objective: Vital signs in last 24 hours: Temp:  [97.6 F (36.4 C)-97.9 F (36.6 C)] 97.6 F (36.4 C) (11/26 0515) Pulse Rate:  [67-90] 67 (11/26 0515) Resp:  [16-20] 16 (11/26 0515) BP: (107-112)/(54-59) 107/59 mmHg (11/26 0515) SpO2:  [95 %-97 %] 97 % (11/26 0515)  Intake/Output from previous day:  Intake/Output Summary (Last 24 hours) at 09/23/14 0658 Last data filed at 09/23/14 0515  Gross per 24 hour  Intake    940 ml  Output   1100 ml  Net   -160 ml    Intake/Output this shift: Total I/O In: 220 [P.O.:220] Out: 750 [Urine:750]  Labs:  Recent Labs  09/22/14 0431 09/23/14 0415  HGB 9.6* 9.3*    Recent Labs  09/22/14 0431 09/23/14 0415  WBC 9.5 11.3*  RBC 3.34* 3.15*  HCT 30.2* 28.8*  PLT 203 174    Recent Labs  09/22/14 0431 09/23/14 0415  NA 137 138  K 4.5 4.8  CL 104 104  CO2 25 26  BUN 7 10  CREATININE 0.53 0.51  GLUCOSE 114* 126*  CALCIUM 7.7* 8.5   No results for input(s): LABPT, INR in the last 72 hours.  EXAM General - Patient is Alert, Appropriate and Oriented Extremity - Neurovascular intact Sensation intact distally Dressing - dressing C/D/I Motor Function - intact, moving foot and toes well on exam.   Past Medical History  Diagnosis Date  . Osteopenia   . CVA (cerebral infarction)   . Hyperlipemia   . Arthritis   . Overactive bladder   . Cancer     skin cancer   . Left patella fracture     Assessment/Plan: 2 Days Post-Op Procedure(s) (LRB): RIGHT TOTAL HIP ARTHROPLASTY ANTERIOR APPROACH (Right) Principal Problem:  S/P right THA, AA  Estimated body mass index is 22.13 kg/(m^2) as calculated from the following:   Height as of this encounter: 5\' 2"  (1.575 m).   Weight as of this encounter: 54.885 kg (121 lb). Up with therapy Plan for discharge tomorrow Discharge to SNF  DVT Prophylaxis - Aspirin Weight Bearing As Tolerated right Leg   Arlee Muslim, PA-C Orthopaedic Surgery 09/23/2014, 6:58 AM

## 2014-09-24 ENCOUNTER — Encounter (HOSPITAL_COMMUNITY): Payer: Self-pay | Admitting: Orthopedic Surgery

## 2014-09-24 MED ORDER — TIZANIDINE HCL 4 MG PO TABS: 4.0000 mg | ORAL_TABLET | Freq: Four times a day (QID) | ORAL | Status: DC | PRN

## 2014-09-24 MED ORDER — ASPIRIN 325 MG PO TBEC
325.0000 mg | DELAYED_RELEASE_TABLET | Freq: Two times a day (BID) | ORAL | Status: AC
Start: 2014-09-24 — End: 2014-10-20

## 2014-09-24 MED ORDER — DSS 100 MG PO CAPS: 100.0000 mg | ORAL_CAPSULE | Freq: Two times a day (BID) | ORAL | Status: AC

## 2014-09-24 MED ORDER — POLYETHYLENE GLYCOL 3350 17 G PO PACK: 17.0000 g | PACK | Freq: Two times a day (BID) | ORAL | Status: DC

## 2014-09-24 MED ORDER — HYDROCODONE-ACETAMINOPHEN 7.5-325 MG PO TABS: 1.0000 | ORAL_TABLET | ORAL | Status: DC | PRN

## 2014-09-24 MED ORDER — FERROUS SULFATE 325 (65 FE) MG PO TABS: 325.0000 mg | ORAL_TABLET | Freq: Three times a day (TID) | ORAL | Status: DC

## 2014-09-24 NOTE — Plan of Care (Signed)
Problem: Consults Goal: Total Joint Replacement Patient Education See Patient Education Module for education specifics.  Outcome: Completed/Met Date Met:  09/24/14 Goal: Diagnosis- Total Joint Replacement Outcome: Completed/Met Date Met:  09/24/14 Primary Total Hip RIGHT, Anterior Goal: Skin Care Protocol Initiated - if Braden Score 18 or less If consults are not indicated, leave blank or document N/A  Outcome: Not Applicable Date Met:  88/89/16 Goal: Nutrition Consult-if indicated Outcome: Not Applicable Date Met:  94/50/38 Goal: Diabetes Guidelines if Diabetic/Glucose > 140 If diabetic or lab glucose is > 140 mg/dl - Initiate Diabetes/Hyperglycemia Guidelines & Document Interventions  Outcome: Not Applicable Date Met:  88/28/00  Problem: Phase I Progression Outcomes Goal: Other Phase I Outcomes/Goals Outcome: Not Applicable Date Met:  34/91/79  Problem: Phase III Progression Outcomes Goal: Other Phase III Outcomes/Goals Outcome: Not Applicable Date Met:  15/05/69  Problem: Discharge Progression Outcomes Goal: Barriers To Progression Addressed/Resolved Outcome: Not Applicable Date Met:  79/48/01 Goal: CMS/Neurovascular status at or above baseline Outcome: Not Applicable Date Met:  65/53/74 Goal: Anticoagulant follow-up in place Outcome: Not Applicable Date Met:  82/70/78 ASA for VTE, no f/u needed. Goal: Pain controlled with appropriate interventions Outcome: Completed/Met Date Met:  09/24/14 Goal: Hemodynamically stable Outcome: Completed/Met Date Met:  67/54/49 Goal: Complications resolved/controlled Outcome: Not Applicable Date Met:  20/10/07 Goal: Tolerates diet Outcome: Completed/Met Date Met:  09/24/14 Goal: Activity appropriate for discharge plan Outcome: Completed/Met Date Met:  09/24/14 Goal: Ambulates safely using assistive device Outcome: Completed/Met Date Met:  09/24/14 Goal: Follows weight - bearing limitations Outcome: Completed/Met Date Met:   09/24/14 Goal: Discharge plan in place and appropriate Outcome: Completed/Met Date Met:  09/24/14

## 2014-09-24 NOTE — Progress Notes (Signed)
     Subjective: 3 Days Post-Op Procedure(s) (LRB): RIGHT TOTAL HIP ARTHROPLASTY ANTERIOR APPROACH (Right)   Patient reports pain as mild, pain controlled. Only complaint is some right thigh pain, sound muscular. No other events throughout the night. Ready to be discharged to skilled nursing facility.  Objective:   VITALS:   Filed Vitals:   09/24/14 0608  BP: 129/73  Pulse: 77  Temp: 98.3 F (36.8 C)  Resp: 16    Dorsiflexion/Plantar flexion intact Incision: dressing C/D/I No cellulitis present Compartment soft  LABS  Recent Labs  09/22/14 0431 09/23/14 0415  HGB 9.6* 9.3*  HCT 30.2* 28.8*  WBC 9.5 11.3*  PLT 203 174     Recent Labs  09/22/14 0431 09/23/14 0415  NA 137 138  K 4.5 4.8  BUN 7 10  CREATININE 0.53 0.51  GLUCOSE 114* 126*     Assessment/Plan: 3 Days Post-Op Procedure(s) (LRB): RIGHT TOTAL HIP ARTHROPLASTY ANTERIOR APPROACH (Right) Up with therapy Discharge to SNF  Follow up in 2 weeks at [facility]. Follow up with OLIN,Elle Vezina D in 2 weeks.  Contact information:  [facility] [address] [name]   PAC  09/24/2014, 7:55 AM

## 2014-09-24 NOTE — Discharge Summary (Signed)
Physician Discharge Summary  Patient ID: [name] MRN: [id number] DOB/AGE: 1928/09/07 80 y.o.  Admit date: 09/21/2014 Discharge date:  09/24/2014  Procedures:  Procedure(s) (LRB): RIGHT TOTAL HIP ARTHROPLASTY ANTERIOR APPROACH (Right)  Attending Physician:  Dr. Paralee Cancel   Admission Diagnoses:   Right hip primary OA / pain  Discharge Diagnoses:  Principal Problem:   S/P right THA, AA  Past Medical History  Diagnosis Date  . Osteopenia   . CVA (cerebral infarction)   . Hyperlipemia   . Arthritis   . Overactive bladder   . Cancer     skin cancer   . Left patella fracture     HPI: [name], 80 y.o. female, has a history of pain and functional disability in the right hip(s) due to arthritis and patient has failed non-surgical conservative treatments for greater than 12 weeks to include NSAID's and/or analgesics, corticosteriod injections, supervised PT with diminished ADL's post treatment, use of assistive devices and activity modification. Onset of symptoms was gradual starting <1 years ago with rapidlly worsening course since that time.The patient noted no past surgery on the right hip(s). Patient currently rates pain in the right hip at 10 out of 10 with activity. Patient has night pain, worsening of pain with activity and weight bearing, trendelenberg gait, pain that interfers with activities of daily living and pain with passive range of motion. Patient has evidence of periarticular osteophytes and joint space narrowing by imaging studies. This condition presents safety issues increasing the risk of falls. There is no current active infection. Risks, benefits and expectations were discussed with the patient. Risks including but not limited to the risk of anesthesia, blood clots, nerve damage, blood vessel damage, failure of the prosthesis, infection and up to and including death. Patient understand the risks, benefits and expectations and wishes to proceed with  surgery.   PCP: Abigail Miyamoto, MD   Discharged Condition: good  Hospital Course:  Patient underwent the above stated procedure on 09/21/2014. Patient tolerated the procedure well and brought to the recovery room in good condition and subsequently to the floor.  POD #1 BP: 107/64 ; Pulse: 90 ; Temp: 97.6 F (36.4 C) ; Resp: 15 Patient reports pain as mild, pain controlled. Some pain on the lateral aspect of the hip. No events throughout the night. Still would like to consider SNF upon discharge.Dorsiflexion/plantar flexion intact, incision: dressing C/D/I, no cellulitis present and compartment soft.   LABS  Basename    HGB  9.6  HCT  30.2   POD #2   Patient reports pain as mild and moderate. Patient is well, but has had some minor complaints of pain in the hip, requiring pain medications. Plan is to go Skilled nursing facility after hospital stay. Probably tomorrow to SNF. Dorsiflexion/plantar flexion intact, incision: dressing C/D/I, no cellulitis present and compartment soft.   LABS  Basename    HGB  9.3  HCT  28.8   POD #3  BP: 129/73 ; Pulse: 77 ; Temp: 98.3 F (36.8 C) ; Resp: 16 Patient reports pain as mild, pain controlled. Only complaint is some right thigh pain, sound muscular. No other events throughout the night. Ready to be discharged to skilled nursing facility. Dorsiflexion/plantar flexion intact, incision: dressing C/D/I, no cellulitis present and compartment soft.   LABS   No new labs  Discharge Exam: General appearance: alert, cooperative and no distress Extremities: Homans sign is negative, no sign of DVT, no edema, redness or tenderness in the calves  or thighs and no ulcers, gangrene or trophic changes  Disposition:     Skilled nursing facility with follow up in 2 weeks   Follow-up Information    Follow up with Mauri Pole, MD. Schedule an appointment as soon as possible for a visit in 2 weeks.   Specialty:  Orthopedic Surgery   Contact information:    [address] [place] [address] [phone]       Discharge Instructions    Call MD / Call 911    Complete by:  As directed   If you experience chest pain or shortness of breath, CALL 911 and be transported to the hospital emergency room.  If you develope a fever above 101 F, pus (white drainage) or increased drainage or redness at the wound, or calf pain, call your surgeon's office.     Change dressing    Complete by:  As directed   Maintain surgical dressing for 10-14 days, or until follow up in the clinic.     Constipation Prevention    Complete by:  As directed   Drink plenty of fluids.  Prune juice may be helpful.  You may use a stool softener, such as Colace (over the counter) 100 mg twice a day.  Use MiraLax (over the counter) for constipation as needed.     Diet - low sodium heart healthy    Complete by:  As directed      Discharge instructions    Complete by:  As directed   Maintain surgical dressing for 10-14 days, or until follow up in the clinic. Follow up in 2 weeks at [facility]. Call with any questions or concerns.     Driving restrictions    Complete by:  As directed   No driving for 4 weeks     Increase activity slowly as tolerated    Complete by:  As directed      TED hose    Complete by:  As directed   Use stockings (TED hose) for 2 weeks on both leg(s).  You may remove them at night for sleeping.     Weight bearing as tolerated    Complete by:  As directed   Laterality:  right  Extremity:  Lower             Medication List    STOP taking these medications        aspirin 325 MG tablet  Replaced by:  aspirin 325 MG EC tablet      TAKE these medications        aspirin 325 MG EC tablet  Take 1 tablet (325 mg total) by mouth 2 (two) times daily.     denosumab 60 MG/ML Soln injection  Commonly known as:  PROLIA  Inject 60 mg into the skin every 6 (six) months. Administer in upper arm, thigh, or abdomen     DSS  100 MG Caps  Take 100 mg by mouth 2 (two) times daily.     ferrous sulfate 325 (65 FE) MG tablet  Take 1 tablet (325 mg total) by mouth 3 (three) times daily after meals.     HYDROcodone-acetaminophen 7.5-325 MG per tablet  Commonly known as:  NORCO  Take 1-2 tablets by mouth every 4 (four) hours as needed for moderate pain.     multivitamin tablet  Take 1 tablet by mouth daily.     MYRBETRIQ 25 MG Tb24 tablet  Generic drug:  mirabegron ER  Take  25 mg by mouth daily.     polyethylene glycol packet  Commonly known as:  MIRALAX / GLYCOLAX  Take 17 g by mouth 2 (two) times daily.     RA CALCIUM PLUS VITAMIN D 600-400 MG-UNIT per tablet  Generic drug:  Calcium Carbonate-Vitamin D  Take 1 tablet by mouth 2 (two) times daily.     simvastatin 20 MG tablet  Commonly known as:  ZOCOR  Take 20 mg by mouth every evening.     tiZANidine 4 MG tablet  Commonly known as:  ZANAFLEX  Take 1 tablet (4 mg total) by mouth every 6 (six) hours as needed for muscle spasms.          Signed: West Pugh. Shekinah Pitones   PA-C  09/24/2014, 8:03 AM

## 2014-09-24 NOTE — Progress Notes (Signed)
Clinical Social Work Department CLINICAL SOCIAL WORK PLACEMENT NOTE 09/24/2014  Patient:  [name], [name]  Account Number:  [id number] Admit date:  09/21/2014  Clinical Social Worker:  Werner Lean, LCSW  Date/time:  09/21/2014 03:04 PM  Clinical Social Work is seeking post-discharge placement for this patient at the following level of care:   SKILLED NURSING   (*CSW will update this form in Epic as items are completed)     Patient/family provided with [facility] Department of Clinical Social Work's list of facilities offering this level of care within the geographic area requested by the patient (or if unable, by the patient's family).  09/21/2014  Patient/family informed of their freedom to choose among providers that offer the needed level of care, that participate in Medicare, Medicaid or managed care program needed by the patient, have an available bed and are willing to accept the patient.    Patient/family informed of [facility]' ownership interest in [facility], as well as of the fact that they are under no obligation to receive care at this facility.  PASARR submitted to EDS on 09/21/2014 PASARR number received on 09/21/2014  FL2 transmitted to all facilities in geographic area requested by pt/family on  09/21/2014 FL2 transmitted to all facilities within larger geographic area on   Patient informed that his/her managed care company has contracts with or will negotiate with  certain facilities, including the following:     Patient/family informed of bed offers received:  09/21/2014 Patient chooses bed at [facility] Physician recommends and patient chooses bed at    Patient to be transferred to [place] on  09/24/2014 Patient to be transferred to facility by [facility] Patient and family notified of transfer on 09/24/2014 Name of family member notified:  DAUGHTER  The following physician request were entered in Epic:   Additional Comments: Pt / daughter  are in agreement with d/c to SNF today. PT approved transport by car. [employer] provided prior approval for SNF. NSG reviewed d/c summary, scripts, avs. Scripts included in d/c packet.  Werner Lean LCSW [phone]

## 2014-09-24 NOTE — Progress Notes (Signed)
Attempted to call report transferred multiple times and disconnected.  Bethann Punches RN

## 2014-09-27 ENCOUNTER — Other Ambulatory Visit: Payer: Self-pay | Admitting: *Deleted

## 2014-09-27 MED ORDER — HYDROCODONE-ACETAMINOPHEN 7.5-325 MG PO TABS: 1.0000 | ORAL_TABLET | ORAL | Status: DC | PRN

## 2014-09-27 MED ORDER — HYDROCODONE-ACETAMINOPHEN 7.5-325 MG PO TABS: ORAL_TABLET | ORAL | Status: DC

## 2014-09-27 NOTE — Telephone Encounter (Signed)
[facility] 

## 2014-09-28 ENCOUNTER — Non-Acute Institutional Stay (SKILLED_NURSING_FACILITY): Payer: Medicare Other | Admitting: Adult Health

## 2014-09-28 DIAGNOSIS — M1611 Unilateral primary osteoarthritis, right hip: Secondary | ICD-10-CM

## 2014-09-28 DIAGNOSIS — N3281 Overactive bladder: Secondary | ICD-10-CM | POA: Diagnosis not present

## 2014-09-28 DIAGNOSIS — M810 Age-related osteoporosis without current pathological fracture: Secondary | ICD-10-CM

## 2014-09-28 DIAGNOSIS — D62 Acute posthemorrhagic anemia: Secondary | ICD-10-CM | POA: Diagnosis not present

## 2014-09-28 DIAGNOSIS — E785 Hyperlipidemia, unspecified: Secondary | ICD-10-CM

## 2014-09-28 DIAGNOSIS — K5900 Constipation, unspecified: Secondary | ICD-10-CM

## 2014-09-29 ENCOUNTER — Encounter: Payer: Self-pay | Admitting: Internal Medicine

## 2014-09-29 ENCOUNTER — Non-Acute Institutional Stay (SKILLED_NURSING_FACILITY): Payer: Medicare Other | Admitting: Internal Medicine

## 2014-09-29 DIAGNOSIS — D62 Acute posthemorrhagic anemia: Secondary | ICD-10-CM

## 2014-09-29 DIAGNOSIS — N3281 Overactive bladder: Secondary | ICD-10-CM

## 2014-09-29 DIAGNOSIS — E785 Hyperlipidemia, unspecified: Secondary | ICD-10-CM

## 2014-09-29 DIAGNOSIS — K5900 Constipation, unspecified: Secondary | ICD-10-CM

## 2014-09-29 DIAGNOSIS — M810 Age-related osteoporosis without current pathological fracture: Secondary | ICD-10-CM

## 2014-09-29 DIAGNOSIS — Z9660 Presence of unspecified orthopedic joint implant: Secondary | ICD-10-CM

## 2014-09-29 DIAGNOSIS — Z96649 Presence of unspecified artificial hip joint: Secondary | ICD-10-CM

## 2014-09-29 DIAGNOSIS — M1611 Unilateral primary osteoarthritis, right hip: Secondary | ICD-10-CM

## 2014-09-29 NOTE — Progress Notes (Signed)
Patient ID: [name], female   DOB: 1928/04/06, 80 y.o.   MRN: [id number]     [facility]   PCP: FRIED, Jaymes Graff, MD  Code Status: full code  No Known Allergies  Chief Complaint  Patient presents with  . New Admit To SNF     HPI:  80 y/o female patient is here for STR post hospital admission from 09/21/14-09/24/14 with right hip OA. She underwent right total hip arthroplasty anterior approach and tolerated the procedure well. She is seen in her room today. Her pain is under control. She is working with therapy team. She has OAB and mentions myrbetriq is not helping her. She would like this discontinued. She is to see dr Jeffie Pollock from urology post facility discharge. No other concerns. No new concern from staff  Review of Systems:  Constitutional: Negative for fever, chills, diaphoresis.  HENT: Negative for congestion Eyes: Negative for eye pain, blurred vision, double vision and discharge.  Respiratory: Negative for cough, sputum production, shortness of breath and wheezing.   Cardiovascular: Negative for chest pain, palpitations, orthopnea and leg swelling.  Gastrointestinal: Negative for heartburn, nausea, vomiting, abdominal pain. Denies constipation Genitourinary: Negative for dysuria and flank pain.  Musculoskeletal: Negative for back pain, falls. Skin: Negative for itching, rash.  Neurological: Negative for dizziness, tingling, focal weakness and headaches.  Psychiatric/Behavioral: Negative for depression. The patient is not nervous/anxious.     Past Medical History  Diagnosis Date  . Osteopenia   . CVA (cerebral infarction)   . Hyperlipemia   . Arthritis   . Overactive bladder   . Cancer     skin cancer   . Left patella fracture    Past Surgical History  Procedure Laterality Date  . Tonsillectomy and adenoidectomy    . Tarsal tunnel surgery    . Ankle fracture surgery      rt  . Great toe arthrodesis, interphalangeal joint       rt  . Plate in left wrist for fracture     . Total hip arthroplasty Right 09/21/2014    Procedure: RIGHT TOTAL HIP ARTHROPLASTY ANTERIOR APPROACH;  Surgeon: Mauri Pole, MD;  Location: [facility];  Service: Orthopedics;  Laterality: Right;   Social History:   reports that she quit smoking about 33 years ago. Her smoking use included Cigarettes. She smoked 0.00 packs per day. She has never used smokeless tobacco. She reports that she drinks alcohol. She reports that she does not use illicit drugs.  Family History  Problem Relation Age of Onset  . Heart disease Mother     Medications: Patient's Medications  New Prescriptions   No medications on file  Previous Medications   ASPIRIN EC 325 MG EC TABLET    Take 1 tablet (325 mg total) by mouth 2 (two) times daily.   CALCIUM CARBONATE-VITAMIN D (RA CALCIUM PLUS VITAMIN D) 600-400 MG-UNIT PER TABLET    Take 1 tablet by mouth 2 (two) times daily.     DENOSUMAB (PROLIA) 60 MG/ML SOLN INJECTION    Inject 60 mg into the skin every 6 (six) months. Administer in upper arm, thigh, or abdomen   DOCUSATE SODIUM 100 MG CAPS    Take 100 mg by mouth 2 (two) times daily.   FERROUS SULFATE 325 (65 FE) MG TABLET    Take 1 tablet (325 mg total) by mouth 3 (three) times daily after meals.   HYDROCODONE-ACETAMINOPHEN (NORCO) 7.5-325 MG PER TABLET    Take  one tablet by mouth every 4 hours as needed for mild to moderate pain; Take two tablets by mouth every 4 hours as needed for severe pain   MULTIPLE VITAMIN (MULTIVITAMIN) TABLET    Take 1 tablet by mouth daily.     MYRBETRIQ 25 MG TB24 TABLET    Take 25 mg by mouth daily.    POLYETHYLENE GLYCOL (MIRALAX / GLYCOLAX) PACKET    Take 17 g by mouth 2 (two) times daily.   SIMVASTATIN (ZOCOR) 20 MG TABLET    Take 20 mg by mouth every evening.   TIZANIDINE (ZANAFLEX) 4 MG TABLET    Take 1 tablet (4 mg total) by mouth every 6 (six) hours as needed for muscle spasms.  Modified Medications   No medications on file    Discontinued Medications   No medications on file     Physical Exam: Filed Vitals:   09/29/14 0954  BP: 130/61  Pulse: 92  Temp: 96.7 F (35.9 C)  Resp: 18  Weight: 127 lb 3.2 oz (57.698 kg)  SpO2: 97%    General- elderly female in no acute distress Head- atraumatic, normocephalic Eyes- PERRLA, EOMI, no pallor, no icterus, no discharge Neck- no cervical lymphadenopathy Throat- moist mucus membrane Cardiovascular- normal s1,s2, no murmurs, dorsalis pedis palpable Respiratory- bilateral clear to auscultation, no wheeze, no rhonchi, no crackles, no use of accessory muscles Abdomen- bowel sounds present, soft, non tender, no CVA tenderness Musculoskeletal- able to move all 4 extremities, no leg edema, using walker Neurological- no focal deficit Skin- warm and dry, glued incision line on right hip healing well Psychiatry- alert and oriented to person, place and time, normal mood and affect    Labs reviewed: Basic Metabolic Panel:  Recent Labs  09/14/14 1500 09/22/14 0431 09/23/14 0415  NA 137 137 138  K 4.8 4.5 4.8  CL 100 104 104  CO2 28 25 26   GLUCOSE 79 114* 126*  BUN 10 7 10   CREATININE 0.49* 0.53 0.51  CALCIUM 9.7 7.7* 8.5   CBC:  Recent Labs  09/14/14 1500 09/22/14 0431 09/23/14 0415  WBC 7.1 9.5 11.3*  HGB 13.1 9.6* 9.3*  HCT 41.2 30.2* 28.8*  MCV 90.7 90.4 91.4  PLT 293 203 174    Assessment/Plan  Right hip OA S/p right total hip arthroplasty. Has f/u with orthopedics in 2 weeks. She is WBAT in right LE. To wear ted hose. Has surgical dressing to be removed in clinic. Will have her work with physical therapy and occupational therapy team to help with gait training and muscle strengthening exercises.fall precautions. Skin care. Encourage to be out of bed. Continue aspirin 325 mg EC bid for dvt prophylaxis for now. Continue nroco 7.5-325 mg 1-2 tab q4h prn for pain and zanaflex 4 mg q6h prn muscle spasm  Osteoporosis Continue prolia injection  twice a year with daily ca-vitd supplement  Constipation On dss 100 mg bid, miralax bid, helpful, monitor  Anemia Continue feso4 325 mg tid, check cbc as post op and on aspirin bid  OAB On myrbetriq 25 mg daily. Not helpful. D/c per pt request  HLD Continue zocor 20 mg daily  Family/ staff Communication: reviewed care plan with patient and nursing supervisor   Goals of care: short term rehabilitation    Labs/tests ordered: cbc    Blanchie Serve, MD  [place] [phone] (Monday-Friday 8 am - 5 pm) [phone] (afterhours)

## 2014-10-27 ENCOUNTER — Other Ambulatory Visit: Payer: Self-pay | Admitting: Nurse Practitioner

## 2015-01-13 ENCOUNTER — Encounter: Payer: Self-pay | Admitting: Cardiovascular Disease

## 2015-01-13 ENCOUNTER — Ambulatory Visit (INDEPENDENT_AMBULATORY_CARE_PROVIDER_SITE_OTHER): Payer: Medicare Other | Admitting: Cardiovascular Disease

## 2015-01-13 ENCOUNTER — Other Ambulatory Visit: Payer: Self-pay

## 2015-01-13 VITALS — BP 140/78 | HR 75 | Ht 61.5 in | Wt 123.1 lb

## 2015-01-13 DIAGNOSIS — E785 Hyperlipidemia, unspecified: Secondary | ICD-10-CM

## 2015-01-13 DIAGNOSIS — I639 Cerebral infarction, unspecified: Secondary | ICD-10-CM

## 2015-01-13 NOTE — Progress Notes (Signed)
Cardiology Office Note   Date:  01/13/2015   ID:  [name], DOB Mar 28, 1928, MRN [id number]  PCP:  Abigail Miyamoto, MD  Cardiologist:  Sherren Mocha, MD    Chief Complaint  Patient presents with  . Fatigue    History of Present Illness: [name] is a 81 y.o. female who presents for follow-up of hyperlipidemia and previous stroke. The patient had a stroke in 2012 with no major residual deficit. She does have some a aphasia when she becomes tired. She takes aspirin 325 mg daily. She's had no bleeding problems. She denies chest pain, shortness of breath, or heart palpitations. She had her right hip replaced last fall. She does have some mild gait instability. No other complaints.  Past Medical History  Diagnosis Date  . Osteopenia   . CVA (cerebral infarction)   . Hyperlipemia   . Arthritis   . Overactive bladder   . Cancer     skin cancer   . Left patella fracture     Past Surgical History  Procedure Laterality Date  . Tonsillectomy and adenoidectomy    . Tarsal tunnel surgery    . Ankle fracture surgery      rt  . Great toe arthrodesis, interphalangeal joint      rt  . Plate in left wrist for fracture     . Total hip arthroplasty Right 09/21/2014    Procedure: RIGHT TOTAL HIP ARTHROPLASTY ANTERIOR APPROACH;  Surgeon: Mauri Pole, MD;  Location: [facility];  Service: Orthopedics;  Laterality: Right;    Current Outpatient Prescriptions  Medication Sig Dispense Refill  . aspirin 325 MG EC tablet Take 325 mg by mouth daily.    . Calcium Carbonate-Vitamin D (RA CALCIUM PLUS VITAMIN D) 600-400 MG-UNIT per tablet Take 1 tablet by mouth 2 (two) times daily.      Marland Kitchen denosumab (PROLIA) 60 MG/ML SOLN injection Inject 60 mg into the skin every 6 (six) months. Administer in upper arm, thigh, or abdomen    . docusate sodium 100 MG CAPS Take 100 mg by mouth 2 (two) times daily. 10 capsule 0  . Multiple Vitamin (MULTIVITAMIN) tablet Take 1 tablet by mouth daily.      Riley Nearing w/o A-FE-Methfol-FA-DHA (VITAMEDMD PLUS RX/QUATREFOLIC) 81-1.9-1.4 &782 MG MISC See admin instructions. Take as directed  2  . simvastatin (ZOCOR) 20 MG tablet Take 20 mg by mouth every evening.    Marland Kitchen HYDROcodone-acetaminophen (NORCO) 7.5-325 MG per tablet Take one tablet by mouth every 4 hours as needed for mild to moderate pain; Take two tablets by mouth every 4 hours as needed for severe pain (Patient not taking: Reported on 01/13/2015) 360 tablet 0   No current facility-administered medications for this visit.    Allergies:   Review of patient's allergies indicates no known allergies.   Social History:  The patient  reports that she quit smoking about 34 years ago. Her smoking use included Cigarettes. She has never used smokeless tobacco. She reports that she drinks alcohol. She reports that she does not use illicit drugs.   Family History:  The patient's family history includes Heart disease in her mother.   ROS:  Please see the history of present illness.  Otherwise, review of systems is positive for back pain.  All other systems are reviewed and negative.   PHYSICAL EXAM: VS:  BP 140/78 mmHg  Pulse 75  Ht 5' 1.5" (1.562 m)  Wt 123 lb 1.9 oz (55.847 kg)  BMI 22.89 kg/m2 , BMI Body mass index is 22.89 kg/(m^2). GEN: Well nourished, well developed, pleasant elderly womanin no acute distress HEENT: normal Neck: no JVD, no masses. No carotid bruits Cardiac: RRR without murmur or gallop                Respiratory:  clear to auscultation bilaterally, normal work of breathing GI: soft, nontender, nondistended, + BS MS: no deformity or atrophy, kyphosis noted Ext: no pretibial edema, pedal pulses 2+= bilaterally Skin: warm and dry, no rash Neuro:  Strength and sensation are intact Psych: euthymic mood, full affect  EKG:  EKG  ordered today. The ekg ordered today shows normal sinus rhythm 75 bpm, within normal limits.  Recent Labs: 09/23/2014: BUN 10; Creatinine 0.51;  Hemoglobin 9.3*; Platelets 174; Potassium 4.8; Sodium 138   Lipid Panel     Component Value Date/Time   CHOL 215* 07/08/2011 0540   TRIG 86 07/08/2011 0540   HDL 76 07/08/2011 0540   CHOLHDL 2.8 07/08/2011 0540   VLDL 17 07/08/2011 0540   LDLCALC 122* 07/08/2011 0540      Wt Readings from Last 3 Encounters:  01/13/15 123 lb 1.9 oz (55.847 kg)  09/29/14 127 lb 3.2 oz (57.698 kg)  01/27/14 123 lb 1.9 oz (55.847 kg)     Cardiac Studies Reviewed: Labs from 09/13/2014: Cholesterol 214, triglycerides 76, LDL 116, HDL 83.  ASSESSMENT AND PLAN: 1.  Hyperlipidemia: The patient is treated with simvastatin. Her primary care physician follows her lipids.  2. History of stroke: Continues on aspirin 325 mg daily. She seems to be getting along well. She has no history of hypertension. I will see her back in one year.  Current medicines are reviewed with the patient today.  The patient does not have concerns regarding medicines.  The following changes have been made:  no change  Labs/ tests ordered today include:   Orders Placed This Encounter  Procedures  . EKG 12-Lead    Disposition:   FU one year  Signed, Sherren Mocha, MD  01/13/2015 6:03 PM    [facility] [place], [place], [place]  [address] Phone: [phone]; Fax: [phone]

## 2015-01-13 NOTE — Patient Instructions (Signed)
Your physician recommends that you continue on your current medications as directed. Please refer to the Current Medication list given to you today.  Your physician wants you to follow-up in: 1 YEAR with Dr Cooper.  You will receive a reminder letter in the mail two months in advance. If you don't receive a letter, please call our office to schedule the follow-up appointment.  

## 2015-03-06 ENCOUNTER — Encounter: Payer: Self-pay | Admitting: Adult Health

## 2015-03-06 NOTE — Progress Notes (Addendum)
Patient ID: [name], female   DOB: 01/04/1928, 81 y.o.   MRN: [id number]   09/28/14  Facility:  [facility] Home Location:  [place] Room Number: 1202-P LEVEL OF CARE:  SNF (31)   Chief Complaint  Patient presents with  . Hospitalization Follow-up    Osteoarthritis S/P right total hip arthroplasty, hyperlipidemia, osteoporosis, constipation, anemia and overactive bladder    HISTORY OF PRESENT ILLNESS:  This is an 81 year old female who has been admitted to [facility] on 09/24/14 from [facility] with osteoarthritis S/P right total hip arthroplasty. She has PMH of osteopenia, CVA, hyperlipidemia and overactive bladder.  She has been admitted for a short-term rehabilitation.  PAST MEDICAL HISTORY:  Past Medical History  Diagnosis Date  . Osteopenia   . CVA (cerebral infarction)   . Hyperlipemia   . Arthritis   . Overactive bladder   . Cancer     skin cancer   . Left patella fracture     CURRENT MEDICATIONS: Reviewed per MAR/see medication list  No Known Allergies   REVIEW OF SYSTEMS:  GENERAL: no change in appetite, no fatigue, no weight changes, no fever, chills or weakness RESPIRATORY: no cough, SOB, DOE, wheezing, hemoptysis CARDIAC: no chest pain, edema or palpitations GI: no abdominal pain, diarrhea, constipation, heart burn, nausea or vomiting  PHYSICAL EXAMINATION  GENERAL: no acute distress, normal body habitus SKIN:  Right hip incision is dry, no redness EYES: conjunctivae normal, sclerae normal, normal eye lids NECK: supple, trachea midline, no neck masses, no thyroid tenderness, no thyromegaly LYMPHATICS: no LAN in the neck, no supraclavicular LAN RESPIRATORY: breathing is even & unlabored, BS CTAB CARDIAC: RRR, no murmur,no extra heart sounds, no edema GI: abdomen soft, normal BS, no masses, no tenderness, no hepatomegaly, no splenomegaly EXTREMITIES: Able to move 4 extremities PSYCHIATRIC: the patient is  alert & oriented to person, affect & behavior appropriate  LABS/RADIOLOGY: Labs reviewed: Basic Metabolic Panel:  Recent Labs  09/14/14 1500 09/22/14 0431 09/23/14 0415  NA 137 137 138  K 4.8 4.5 4.8  CL 100 104 104  CO2 28 25 26   GLUCOSE 79 114* 126*  BUN 10 7 10   CREATININE 0.49* 0.53 0.51  CALCIUM 9.7 7.7* 8.5   CBC:  Recent Labs  09/14/14 1500 09/22/14 0431 09/23/14 0415  WBC 7.1 9.5 11.3*  HGB 13.1 9.6* 9.3*  HCT 41.2 30.2* 28.8*  MCV 90.7 90.4 91.4  PLT 293 203 174     ASSESSMENT/PLAN:  Osteoarthritis S/P right total hip arthroplasty - for rehabilitation; continue Norco 7.5/325 mg 1-2 tabs by mouth every 4 hours when necessary for pain and Zanaflex 4 mg 1 by mouth every 6 hours when necessary for muscle spasm Hyperlipidemia - continue Zocor 20 mg 1 tab by mouth every evening Osteoporosis - continue Prolia 60 mg subcutaneous every 6 months Constipation - continue Colace 100 mg by mouth twice a day and MiraLAX 17 g by mouth twice a day Anemia, acute blood loss - hemoglobin 9.3; continue ferrous sulfate 325 mg 1 tab by mouth 3 times a day Overactive bladder - continue Mirabegron ER 25 mg by mouth daily   Goals of care:  Short-term rehabilitation   Labs/test ordered:  none  Spent 50 minutes in patient care.   [facility], NP [employer] [phone]

## 2015-04-25 ENCOUNTER — Other Ambulatory Visit: Payer: Self-pay

## 2015-12-08 DIAGNOSIS — R3915 Urgency of urination: Secondary | ICD-10-CM | POA: Diagnosis not present

## 2015-12-16 DIAGNOSIS — M810 Age-related osteoporosis without current pathological fracture: Secondary | ICD-10-CM | POA: Diagnosis not present

## 2016-01-02 DIAGNOSIS — L602 Onychogryphosis: Secondary | ICD-10-CM | POA: Diagnosis not present

## 2016-01-02 DIAGNOSIS — I739 Peripheral vascular disease, unspecified: Secondary | ICD-10-CM | POA: Diagnosis not present

## 2016-01-05 DIAGNOSIS — R3915 Urgency of urination: Secondary | ICD-10-CM | POA: Diagnosis not present

## 2016-01-19 DIAGNOSIS — H2513 Age-related nuclear cataract, bilateral: Secondary | ICD-10-CM | POA: Diagnosis not present

## 2016-02-01 DIAGNOSIS — Z1231 Encounter for screening mammogram for malignant neoplasm of breast: Secondary | ICD-10-CM | POA: Diagnosis not present

## 2016-02-03 DIAGNOSIS — R3915 Urgency of urination: Secondary | ICD-10-CM | POA: Diagnosis not present

## 2016-03-01 DIAGNOSIS — R3915 Urgency of urination: Secondary | ICD-10-CM | POA: Diagnosis not present

## 2016-03-05 ENCOUNTER — Encounter: Payer: Self-pay | Admitting: Cardiovascular Disease

## 2016-03-05 ENCOUNTER — Ambulatory Visit (INDEPENDENT_AMBULATORY_CARE_PROVIDER_SITE_OTHER): Payer: Medicare Other | Admitting: Cardiovascular Disease

## 2016-03-05 VITALS — BP 122/66 | HR 84 | Ht 61.5 in | Wt 135.5 lb

## 2016-03-05 DIAGNOSIS — E785 Hyperlipidemia, unspecified: Secondary | ICD-10-CM | POA: Diagnosis not present

## 2016-03-05 NOTE — Patient Instructions (Signed)

## 2016-03-05 NOTE — Progress Notes (Signed)
Cardiology Office Note Date:  03/05/2016   ID:  [name], DOB Jun 27, 1928, MRN [id number]  PCP:  Orpah Melter, MD  Cardiologist:  Sherren Mocha, MD    Chief Complaint  Patient presents with  . Cerebrovascular Accident    c/o swelling in left ankle, denies cp,sob,or claudication   History of Present Illness: [name] is a 82 y.o. female who presents for follow-up evaluation. follow-up of hyperlipidemia and previous stroke. The patient had a stroke in 2012 with no major residual deficit. She complains of weight gain over the past year. Doesn't feel like her eating habits have changed, but less active. No cold intolerance, hair loss, dyspnea, chest pain, constitutional symptoms, or other complaints.   Past Medical History  Diagnosis Date  . Osteopenia   . CVA (cerebral infarction)   . Hyperlipemia   . Arthritis   . Overactive bladder   . Cancer ([place])     skin cancer   . Left patella fracture     Past Surgical History  Procedure Laterality Date  . Tonsillectomy and adenoidectomy    . Tarsal tunnel surgery    . Ankle fracture surgery      rt  . Great toe arthrodesis, interphalangeal joint      rt  . Plate in left wrist for fracture     . Total hip arthroplasty Right 09/21/2014    Procedure: RIGHT TOTAL HIP ARTHROPLASTY ANTERIOR APPROACH;  Surgeon: Mauri Pole, MD;  Location: [facility];  Service: Orthopedics;  Laterality: Right;    Current Outpatient Prescriptions  Medication Sig Dispense Refill  . aspirin 325 MG EC tablet Take 325 mg by mouth daily.    Marland Kitchen denosumab (PROLIA) 60 MG/ML SOLN injection Inject 60 mg into the skin every 6 (six) months. Administer in upper arm, thigh, or abdomen    . docusate sodium 100 MG CAPS Take 100 mg by mouth 2 (two) times daily. 10 capsule 0  . Multiple Vitamin (MULTIVITAMIN) tablet Take 1 tablet by mouth daily.      . simvastatin (ZOCOR) 20 MG tablet Take 20 mg by mouth every evening.     No current facility-administered  medications for this visit.    Allergies:   Review of patient's allergies indicates no known allergies.   Social History:  The patient  reports that she quit smoking about 35 years ago. Her smoking use included Cigarettes. She has never used smokeless tobacco. She reports that she drinks alcohol. She reports that she does not use illicit drugs.   Family History:  The patient's family history includes Heart disease in her mother.   ROS:  Please see the history of present illness.  Otherwise, review of systems is positive for back pain, constipation.  All other systems are reviewed and negative.    PHYSICAL EXAM: VS:  BP 122/66 mmHg  Pulse 84  Ht 5' 1.5" (1.562 m)  Wt 135 lb 8 oz (61.462 kg)  BMI 25.19 kg/m2 , BMI Body mass index is 25.19 kg/(m^2). GEN: Well nourished, well developed, pleasant elderly woman in no acute distress HEENT: normal Neck: no JVD, no masses. No carotid bruits Cardiac: RRR without murmur or gallop                Respiratory:  clear to auscultation bilaterally, normal work of breathing GI: soft, nontender, nondistended, + BS MS: no deformity or atrophy Ext: no pretibial edema, pedal pulses 2+= bilaterally Skin: warm and dry, no rash Neuro:  Strength and sensation  are intact Psych: euthymic mood, full affect  EKG:  EKG is ordered today. The ekg ordered today shows NSR 85 bpm, LAE, otherwise WNL  Recent Labs: No results found for requested labs within last 365 days.   Lipid Panel     Component Value Date/Time   CHOL 215* 07/08/2011 0540   TRIG 86 07/08/2011 0540   HDL 76 07/08/2011 0540   CHOLHDL 2.8 07/08/2011 0540   VLDL 17 07/08/2011 0540   LDLCALC 122* 07/08/2011 0540      Wt Readings from Last 3 Encounters:  03/05/16 135 lb 8 oz (61.462 kg)  01/13/15 123 lb 1.9 oz (55.847 kg)  09/29/14 127 lb 3.2 oz (57.698 kg)    ASSESSMENT AND PLAN: 1.  Hyperlipidemia: treated with simvastatin. Followed by PCP  2. Hx stroke: treated with aspirin and a  statin drug. Pt is normotensive. No hx of atrial fibrillation.  Current medicines are reviewed with the patient today.  The patient does not have concerns regarding medicines.  Labs/ tests ordered today include:   Orders Placed This Encounter  Procedures  . EKG 12-Lead   Disposition:   FU one year  Signed, Sherren Mocha, MD  03/05/2016 1:34 PM    [facility] [place], [place], [place]  [address] Phone: [phone]; Fax: [phone]

## 2016-05-16 DIAGNOSIS — R350 Frequency of micturition: Secondary | ICD-10-CM | POA: Diagnosis not present

## 2016-07-12 DIAGNOSIS — M810 Age-related osteoporosis without current pathological fracture: Secondary | ICD-10-CM | POA: Diagnosis not present

## 2016-07-12 DIAGNOSIS — Z23 Encounter for immunization: Secondary | ICD-10-CM | POA: Diagnosis not present

## 2016-08-03 DIAGNOSIS — I70293 Other atherosclerosis of native arteries of extremities, bilateral legs: Secondary | ICD-10-CM | POA: Diagnosis not present

## 2016-08-03 DIAGNOSIS — L602 Onychogryphosis: Secondary | ICD-10-CM | POA: Diagnosis not present

## 2016-09-17 DIAGNOSIS — L570 Actinic keratosis: Secondary | ICD-10-CM | POA: Diagnosis not present

## 2016-09-17 DIAGNOSIS — I6930 Unspecified sequelae of cerebral infarction: Secondary | ICD-10-CM | POA: Diagnosis not present

## 2016-09-17 DIAGNOSIS — D692 Other nonthrombocytopenic purpura: Secondary | ICD-10-CM | POA: Diagnosis not present

## 2016-09-17 DIAGNOSIS — N839 Noninflammatory disorder of ovary, fallopian tube and broad ligament, unspecified: Secondary | ICD-10-CM | POA: Diagnosis not present

## 2016-09-17 DIAGNOSIS — M810 Age-related osteoporosis without current pathological fracture: Secondary | ICD-10-CM | POA: Diagnosis not present

## 2016-09-17 DIAGNOSIS — I679 Cerebrovascular disease, unspecified: Secondary | ICD-10-CM | POA: Diagnosis not present

## 2016-09-17 DIAGNOSIS — Z0000 Encounter for general adult medical examination without abnormal findings: Secondary | ICD-10-CM | POA: Diagnosis not present

## 2016-09-17 DIAGNOSIS — M1990 Unspecified osteoarthritis, unspecified site: Secondary | ICD-10-CM | POA: Diagnosis not present

## 2016-09-17 DIAGNOSIS — E559 Vitamin D deficiency, unspecified: Secondary | ICD-10-CM | POA: Diagnosis not present

## 2016-09-17 DIAGNOSIS — E785 Hyperlipidemia, unspecified: Secondary | ICD-10-CM | POA: Diagnosis not present

## 2016-10-05 DIAGNOSIS — L84 Corns and callosities: Secondary | ICD-10-CM | POA: Diagnosis not present

## 2016-10-05 DIAGNOSIS — L602 Onychogryphosis: Secondary | ICD-10-CM | POA: Diagnosis not present

## 2016-10-05 DIAGNOSIS — I739 Peripheral vascular disease, unspecified: Secondary | ICD-10-CM | POA: Diagnosis not present

## 2017-01-09 DIAGNOSIS — M810 Age-related osteoporosis without current pathological fracture: Secondary | ICD-10-CM | POA: Diagnosis not present

## 2017-01-17 DIAGNOSIS — H2513 Age-related nuclear cataract, bilateral: Secondary | ICD-10-CM | POA: Diagnosis not present

## 2017-02-18 DIAGNOSIS — H43811 Vitreous degeneration, right eye: Secondary | ICD-10-CM | POA: Diagnosis not present

## 2017-02-18 DIAGNOSIS — H2513 Age-related nuclear cataract, bilateral: Secondary | ICD-10-CM | POA: Diagnosis not present

## 2017-03-21 ENCOUNTER — Encounter: Payer: Self-pay | Admitting: Cardiovascular Disease

## 2017-04-08 ENCOUNTER — Encounter: Payer: Self-pay | Admitting: Cardiovascular Disease

## 2017-04-08 ENCOUNTER — Ambulatory Visit (INDEPENDENT_AMBULATORY_CARE_PROVIDER_SITE_OTHER): Payer: Medicare Other | Admitting: Cardiovascular Disease

## 2017-04-08 VITALS — BP 124/64 | HR 86 | Ht 61.5 in | Wt 132.8 lb

## 2017-04-08 DIAGNOSIS — E785 Hyperlipidemia, unspecified: Secondary | ICD-10-CM | POA: Diagnosis not present

## 2017-04-08 NOTE — Progress Notes (Signed)
Cardiology Office Note Date:  04/10/2017   ID:  [name], DOB 1927-12-08, MRN [id number]  PCP:  Orpah Melter, MD  Cardiologist:  Sherren Mocha, MD    Chief Complaint  Patient presents with  . Follow-up     History of Present Illness: [name] is a 83 y.o. female who presents for follow-up of hyperlipidemia and previous stroke. The patient had a stroke in 2012 with no major residual deficit. She has no specific cardiac-related complaints today, but does complain of gait unsteadiness. She's not as active as in the past. No headache. C/o worsening vision. Today, she denies symptoms of palpitations, chest pain, shortness of breath, orthopnea, PND, lower extremity edema, dizziness, or syncope.   Past Medical History:  Diagnosis Date  . Arthritis   . Cancer ([place])    skin cancer   . CVA (cerebral infarction)   . Hyperlipemia   . Left patella fracture   . Osteopenia   . Overactive bladder     Past Surgical History:  Procedure Laterality Date  . ANKLE FRACTURE SURGERY     rt  . GREAT TOE ARTHRODESIS, INTERPHALANGEAL JOINT     rt  . plate in left wrist for fracture     . tarsal tunnel surgery    . TONSILLECTOMY AND ADENOIDECTOMY    . TOTAL HIP ARTHROPLASTY Right 09/21/2014   Procedure: RIGHT TOTAL HIP ARTHROPLASTY ANTERIOR APPROACH;  Surgeon: Mauri Pole, MD;  Location: [facility];  Service: Orthopedics;  Laterality: Right;    Current Outpatient Prescriptions  Medication Sig Dispense Refill  . aspirin 325 MG EC tablet Take 325 mg by mouth daily.    Marland Kitchen denosumab (PROLIA) 60 MG/ML SOLN injection Inject 60 mg into the skin every 6 (six) months. Administer in upper arm, thigh, or abdomen    . docusate sodium 100 MG CAPS Take 100 mg by mouth 2 (two) times daily. 10 capsule 0  . Multiple Vitamin (MULTIVITAMIN) tablet Take 1 tablet by mouth daily.      . simvastatin (ZOCOR) 20 MG tablet Take 20 mg by mouth every evening.     No current facility-administered medications  for this visit.     Allergies:   Patient has no known allergies.   Social History:  The patient  reports that she quit smoking about 36 years ago. Her smoking use included Cigarettes. She has never used smokeless tobacco. She reports that she drinks alcohol. She reports that she does not use drugs.   Family History:  The patient's family history includes Heart disease in her mother.   ROS:  Please see the history of present illness.  Otherwise, review of systems is positive for gait unsteadiness.  All other systems are reviewed and negative.   PHYSICAL EXAM: VS:  BP 124/64   Pulse 86   Ht 5' 1.5" (1.562 m)   Wt 132 lb 12.8 oz (60.2 kg)   BMI 24.69 kg/m  , BMI Body mass index is 24.69 kg/m. GEN: Well nourished, well developed, pleasant elderly woman in no acute distress  HEENT: normal  Neck: no JVD, no masses. No carotid bruits Cardiac: RRR without murmur or gallop                Respiratory:  clear to auscultation bilaterally, normal work of breathing GI: soft, nontender, nondistended, + BS MS: no deformity or atrophy  Ext: no pretibial edema, pedal pulses 2+= bilaterally Skin: warm and dry, no rash Neuro:  Strength and sensation are intact  Psych: euthymic mood, full affect  EKG:  EKG is ordered today. The ekg ordered today shows NSR 85 bpm, within normal limits  Recent Labs: No results found for requested labs within last 8760 hours.   Lipid Panel     Component Value Date/Time   CHOL 215 (H) 07/08/2011 0540   TRIG 86 07/08/2011 0540   HDL 76 07/08/2011 0540   CHOLHDL 2.8 07/08/2011 0540   VLDL 17 07/08/2011 0540   LDLCALC 122 (H) 07/08/2011 0540      Wt Readings from Last 3 Encounters:  04/08/17 132 lb 12.8 oz (60.2 kg)  03/05/16 135 lb 8 oz (61.5 kg)  01/13/15 123 lb 1.9 oz (55.8 kg)     ASSESSMENT AND PLAN: 1.  Hyperlipidemia: treated with simvastatin. Most recent lipids reviewed with cholesterol 191, LDL 100, HDL 77 Trig 69.  2. Hx stroke: continue ASA,  statin drug  3. Cataract surgery: pt at low risk of upcoming surgery - ok to proceed without further testing  Current medicines are reviewed with the patient today.  The patient does not have concerns regarding medicines.  Labs/ tests ordered today include:   Orders Placed This Encounter  Procedures  . EKG 12-Lead    Disposition:   FU one year  Signed, Sherren Mocha, MD  04/10/2017 1:48 PM    [facility] [place], [place], [place]  [address] Phone: [phone]; Fax: [phone]

## 2017-04-08 NOTE — Patient Instructions (Signed)

## 2017-04-11 DIAGNOSIS — H2511 Age-related nuclear cataract, right eye: Secondary | ICD-10-CM | POA: Diagnosis not present

## 2017-04-18 DIAGNOSIS — M79671 Pain in right foot: Secondary | ICD-10-CM | POA: Diagnosis not present

## 2017-04-18 DIAGNOSIS — I739 Peripheral vascular disease, unspecified: Secondary | ICD-10-CM | POA: Diagnosis not present

## 2017-04-18 DIAGNOSIS — B351 Tinea unguium: Secondary | ICD-10-CM | POA: Diagnosis not present

## 2017-04-18 DIAGNOSIS — M79672 Pain in left foot: Secondary | ICD-10-CM | POA: Diagnosis not present

## 2017-04-30 DIAGNOSIS — H2512 Age-related nuclear cataract, left eye: Secondary | ICD-10-CM | POA: Diagnosis not present

## 2017-05-02 DIAGNOSIS — H2511 Age-related nuclear cataract, right eye: Secondary | ICD-10-CM | POA: Diagnosis not present

## 2017-05-02 DIAGNOSIS — H2512 Age-related nuclear cataract, left eye: Secondary | ICD-10-CM | POA: Diagnosis not present

## 2017-06-24 DIAGNOSIS — H6123 Impacted cerumen, bilateral: Secondary | ICD-10-CM | POA: Diagnosis not present

## 2017-06-24 DIAGNOSIS — L570 Actinic keratosis: Secondary | ICD-10-CM | POA: Diagnosis not present

## 2017-06-24 DIAGNOSIS — M810 Age-related osteoporosis without current pathological fracture: Secondary | ICD-10-CM | POA: Diagnosis not present

## 2017-06-24 DIAGNOSIS — L989 Disorder of the skin and subcutaneous tissue, unspecified: Secondary | ICD-10-CM | POA: Diagnosis not present

## 2017-06-26 DIAGNOSIS — Z961 Presence of intraocular lens: Secondary | ICD-10-CM | POA: Diagnosis not present

## 2017-07-18 DIAGNOSIS — T1501XA Foreign body in cornea, right eye, initial encounter: Secondary | ICD-10-CM | POA: Diagnosis not present

## 2017-07-25 DIAGNOSIS — Z23 Encounter for immunization: Secondary | ICD-10-CM | POA: Diagnosis not present

## 2017-07-25 DIAGNOSIS — M810 Age-related osteoporosis without current pathological fracture: Secondary | ICD-10-CM | POA: Diagnosis not present

## 2017-08-14 DIAGNOSIS — L600 Ingrowing nail: Secondary | ICD-10-CM | POA: Diagnosis not present

## 2017-08-14 DIAGNOSIS — M79675 Pain in left toe(s): Secondary | ICD-10-CM | POA: Diagnosis not present

## 2017-08-26 DIAGNOSIS — L600 Ingrowing nail: Secondary | ICD-10-CM | POA: Diagnosis not present

## 2017-09-04 DIAGNOSIS — B351 Tinea unguium: Secondary | ICD-10-CM | POA: Diagnosis not present

## 2017-09-04 DIAGNOSIS — M79675 Pain in left toe(s): Secondary | ICD-10-CM | POA: Diagnosis not present

## 2017-09-04 DIAGNOSIS — M79674 Pain in right toe(s): Secondary | ICD-10-CM | POA: Diagnosis not present

## 2017-09-18 DIAGNOSIS — M810 Age-related osteoporosis without current pathological fracture: Secondary | ICD-10-CM | POA: Diagnosis not present

## 2017-09-18 DIAGNOSIS — E785 Hyperlipidemia, unspecified: Secondary | ICD-10-CM | POA: Diagnosis not present

## 2017-09-18 DIAGNOSIS — I6930 Unspecified sequelae of cerebral infarction: Secondary | ICD-10-CM | POA: Diagnosis not present

## 2017-09-18 DIAGNOSIS — Z0000 Encounter for general adult medical examination without abnormal findings: Secondary | ICD-10-CM | POA: Diagnosis not present

## 2017-10-04 DIAGNOSIS — M79674 Pain in right toe(s): Secondary | ICD-10-CM | POA: Diagnosis not present

## 2017-10-04 DIAGNOSIS — M79675 Pain in left toe(s): Secondary | ICD-10-CM | POA: Diagnosis not present

## 2017-10-04 DIAGNOSIS — L02611 Cutaneous abscess of right foot: Secondary | ICD-10-CM | POA: Diagnosis not present

## 2017-10-04 DIAGNOSIS — B351 Tinea unguium: Secondary | ICD-10-CM | POA: Diagnosis not present

## 2017-10-17 DIAGNOSIS — L600 Ingrowing nail: Secondary | ICD-10-CM | POA: Diagnosis not present

## 2017-11-01 DIAGNOSIS — M79675 Pain in left toe(s): Secondary | ICD-10-CM | POA: Diagnosis not present

## 2017-11-01 DIAGNOSIS — M79674 Pain in right toe(s): Secondary | ICD-10-CM | POA: Diagnosis not present

## 2017-11-01 DIAGNOSIS — B351 Tinea unguium: Secondary | ICD-10-CM | POA: Diagnosis not present

## 2017-11-26 DIAGNOSIS — M810 Age-related osteoporosis without current pathological fracture: Secondary | ICD-10-CM | POA: Diagnosis not present

## 2017-11-29 DIAGNOSIS — M79675 Pain in left toe(s): Secondary | ICD-10-CM | POA: Diagnosis not present

## 2017-11-29 DIAGNOSIS — M79674 Pain in right toe(s): Secondary | ICD-10-CM | POA: Diagnosis not present

## 2017-11-29 DIAGNOSIS — B351 Tinea unguium: Secondary | ICD-10-CM | POA: Diagnosis not present

## 2017-12-26 DIAGNOSIS — Z79899 Other long term (current) drug therapy: Secondary | ICD-10-CM | POA: Diagnosis not present

## 2018-01-21 DIAGNOSIS — M810 Age-related osteoporosis without current pathological fracture: Secondary | ICD-10-CM | POA: Diagnosis not present

## 2018-02-26 DIAGNOSIS — L03031 Cellulitis of right toe: Secondary | ICD-10-CM | POA: Diagnosis not present

## 2018-02-26 DIAGNOSIS — B351 Tinea unguium: Secondary | ICD-10-CM | POA: Diagnosis not present

## 2018-03-13 DIAGNOSIS — L600 Ingrowing nail: Secondary | ICD-10-CM | POA: Diagnosis not present

## 2018-06-16 ENCOUNTER — Encounter: Payer: Self-pay | Admitting: Cardiovascular Disease

## 2018-06-16 ENCOUNTER — Encounter

## 2018-06-16 ENCOUNTER — Ambulatory Visit: Payer: Medicare Other | Admitting: Cardiovascular Disease

## 2018-06-16 VITALS — BP 124/64 | HR 82 | Ht 61.5 in | Wt 133.6 lb

## 2018-06-16 DIAGNOSIS — R0602 Shortness of breath: Secondary | ICD-10-CM

## 2018-06-16 DIAGNOSIS — E785 Hyperlipidemia, unspecified: Secondary | ICD-10-CM | POA: Diagnosis not present

## 2018-06-16 MED ORDER — ASPIRIN 81 MG PO TBEC: 81.0000 mg | DELAYED_RELEASE_TABLET | Freq: Every day | ORAL | Status: AC

## 2018-06-16 NOTE — Progress Notes (Signed)
Cardiology Office Note Date:  06/16/2018   ID:  Aiyanna Awtrey, DOB July 18, 1928, MRN [id number]  PCP:  Orpah Melter, MD  Cardiologist:  Sherren Mocha, MD    Chief Complaint  Patient presents with  . Follow-up    hx stroke     History of Present Illness: Kasidee Voisin is a 84 y.o. female who presents for  follow-up of hyperlipidemia and previous stroke. The patient had a stroke in 2012 with no major residual deficit.  The patient is here alone today.  She complains of shortness of breath during her walk back up to her house from the [place].  States this is new over the past year.  She denies orthopnea, PND, heart palpitations, lightheadedness, or chest pain.  She has no shortness of breath with normal activities such as activities of daily living.  No cough, fever, chills, or other specific complaints.  Past Medical History:  Diagnosis Date  . Arthritis   . Cancer ([place])    skin cancer   . CVA (cerebral infarction)   . Hyperlipemia   . Left patella fracture   . Osteopenia   . Overactive bladder     Past Surgical History:  Procedure Laterality Date  . ANKLE FRACTURE SURGERY     rt  . GREAT TOE ARTHRODESIS, INTERPHALANGEAL JOINT     rt  . plate in left wrist for fracture     . tarsal tunnel surgery    . TONSILLECTOMY AND ADENOIDECTOMY    . TOTAL HIP ARTHROPLASTY Right 09/21/2014   Procedure: RIGHT TOTAL HIP ARTHROPLASTY ANTERIOR APPROACH;  Surgeon: Mauri Pole, MD;  Location: [facility];  Service: Orthopedics;  Laterality: Right;    Current Outpatient Medications  Medication Sig Dispense Refill  . aspirin 81 MG EC tablet Take 1 tablet (81 mg total) by mouth daily.    Marland Kitchen denosumab (PROLIA) 60 MG/ML SOLN injection Inject 60 mg into the skin every 6 (six) months. Administer in upper arm, thigh, or abdomen    . docusate sodium 100 MG CAPS Take 100 mg by mouth 2 (two) times daily. 10 capsule 0  . Multiple Vitamin (MULTIVITAMIN) tablet Take 1 tablet by mouth daily.      .  simvastatin (ZOCOR) 20 MG tablet Take 20 mg by mouth every evening.     No current facility-administered medications for this visit.     Allergies:   Patient has no known allergies.   Social History:  The patient  reports that she quit smoking about 37 years ago. Her smoking use included cigarettes. She has never used smokeless tobacco. She reports that she drinks alcohol. She reports that she does not use drugs.   Family History:  The patient's  family history includes Heart disease in her mother.    ROS:  Please see the history of present illness.   All other systems are reviewed and negative.    PHYSICAL EXAM: VS:  BP 124/64   Pulse 82   Ht 5' 1.5" (1.562 m)   Wt 133 lb 9.6 oz (60.6 kg)   SpO2 95%   BMI 24.83 kg/m  , BMI Body mass index is 24.83 kg/m. GEN: Well nourished, well developed, pleasant elderly woman in no acute distress  HEENT: normal  Neck: no JVD, no masses. No carotid bruits Cardiac: RRR with 2/6 systolic murmur at the RUSB             Respiratory:  clear to auscultation bilaterally, normal work of breathing GI: soft,  nontender, nondistended, + BS MS: no deformity or atrophy  Ext: no pretibial edema, pedal pulses 2+= bilaterally Skin: warm and dry, no rash Neuro:  Strength and sensation are intact Psych: euthymic mood, full affect  EKG:  EKG is ordered today. The ekg ordered today shows normal sinus rhythm 82 bpm, possible left atrial enlargement, otherwise normal EKG  Recent Labs: No results found for requested labs within last 8760 hours.   Lipid Panel     Component Value Date/Time   CHOL 215 (H) 07/08/2011 0540   TRIG 86 07/08/2011 0540   HDL 76 07/08/2011 0540   CHOLHDL 2.8 07/08/2011 0540   VLDL 17 07/08/2011 0540   LDLCALC 122 (H) 07/08/2011 0540      Wt Readings from Last 3 Encounters:  06/16/18 133 lb 9.6 oz (60.6 kg)  04/08/17 132 lb 12.8 oz (60.2 kg)  03/05/16 135 lb 8 oz (61.5 kg)    ASSESSMENT AND PLAN: 1.  Hyperlipidemia:  Treated with simvastatin.  Most recent labs reviewed with an LDL cholesterol of 97 and an HDL cholesterol of 86.  2. Hx stroke: Continue aspirin.  Advised to reduce dose to 81 mg daily.  Continue statin drug.  3.  Shortness of breath: No signs or exam findings of congestive heart failure.  We discussed attempts at increasing her activity level.  If she notes progressive symptoms I asked her to call and I would plan to evaluate further with at least an echocardiogram.  Otherwise I will plan to see back in 1 year.  Current medicines are reviewed with the patient today.  The patient does not have concerns regarding medicines.  Labs/ tests ordered today include:   Orders Placed This Encounter  Procedures  . EKG 12-Lead   Disposition:   FU one year  Signed, Sherren Mocha, MD  06/16/2018 2:37 PM    [address], [place], [place]  [address] Phone: [phone]; Fax: [phone]

## 2018-06-16 NOTE — Patient Instructions (Signed)
Medication Instructions:  1) DECREASE ASPIRIN to 81 mg daily  Labwork: None  Testing/Procedures: None  Follow-Up: Your provider wants you to follow-up in: 1 year with Dr. Cooper. You will receive a reminder letter in the mail two months in advance. If you don't receive a letter, please call our office to schedule the follow-up appointment.    Any Other Special Instructions Will Be Listed Below (If Applicable).     If you need a refill on your cardiac medications before your next appointment, please call your pharmacy.   

## 2018-07-02 DIAGNOSIS — H1013 Acute atopic conjunctivitis, bilateral: Secondary | ICD-10-CM | POA: Diagnosis not present

## 2018-08-07 DIAGNOSIS — M25562 Pain in left knee: Secondary | ICD-10-CM | POA: Diagnosis not present

## 2018-08-07 DIAGNOSIS — M2242 Chondromalacia patellae, left knee: Secondary | ICD-10-CM | POA: Diagnosis not present

## 2018-08-07 DIAGNOSIS — M1611 Unilateral primary osteoarthritis, right hip: Secondary | ICD-10-CM | POA: Diagnosis not present

## 2018-08-07 DIAGNOSIS — M25552 Pain in left hip: Secondary | ICD-10-CM | POA: Diagnosis not present

## 2018-08-20 DIAGNOSIS — Z23 Encounter for immunization: Secondary | ICD-10-CM | POA: Diagnosis not present

## 2018-08-20 DIAGNOSIS — M810 Age-related osteoporosis without current pathological fracture: Secondary | ICD-10-CM | POA: Diagnosis not present

## 2018-09-19 DIAGNOSIS — M810 Age-related osteoporosis without current pathological fracture: Secondary | ICD-10-CM | POA: Diagnosis not present

## 2018-09-19 DIAGNOSIS — I6930 Unspecified sequelae of cerebral infarction: Secondary | ICD-10-CM | POA: Diagnosis not present

## 2018-09-19 DIAGNOSIS — Z0000 Encounter for general adult medical examination without abnormal findings: Secondary | ICD-10-CM | POA: Diagnosis not present

## 2018-09-19 DIAGNOSIS — E785 Hyperlipidemia, unspecified: Secondary | ICD-10-CM | POA: Diagnosis not present

## 2019-05-12 DIAGNOSIS — E7800 Pure hypercholesterolemia, unspecified: Secondary | ICD-10-CM | POA: Diagnosis not present

## 2019-05-12 DIAGNOSIS — M810 Age-related osteoporosis without current pathological fracture: Secondary | ICD-10-CM | POA: Diagnosis not present

## 2019-05-12 DIAGNOSIS — I679 Cerebrovascular disease, unspecified: Secondary | ICD-10-CM | POA: Diagnosis not present

## 2019-05-27 DIAGNOSIS — M810 Age-related osteoporosis without current pathological fracture: Secondary | ICD-10-CM | POA: Diagnosis not present

## 2019-05-29 DIAGNOSIS — M810 Age-related osteoporosis without current pathological fracture: Secondary | ICD-10-CM | POA: Diagnosis not present

## 2019-07-24 DIAGNOSIS — Z23 Encounter for immunization: Secondary | ICD-10-CM | POA: Diagnosis not present

## 2019-09-28 DIAGNOSIS — M810 Age-related osteoporosis without current pathological fracture: Secondary | ICD-10-CM | POA: Diagnosis not present

## 2019-09-28 DIAGNOSIS — E7800 Pure hypercholesterolemia, unspecified: Secondary | ICD-10-CM | POA: Diagnosis not present

## 2019-09-28 DIAGNOSIS — M1990 Unspecified osteoarthritis, unspecified site: Secondary | ICD-10-CM | POA: Diagnosis not present

## 2019-09-28 DIAGNOSIS — Z0000 Encounter for general adult medical examination without abnormal findings: Secondary | ICD-10-CM | POA: Diagnosis not present

## 2019-10-26 ENCOUNTER — Ambulatory Visit: Payer: Medicare Other | Admitting: Cardiovascular Disease

## 2019-10-30 ENCOUNTER — Inpatient Hospital Stay (HOSPITAL_COMMUNITY)
Admission: EM | Admit: 2019-10-30 | Discharge: 2019-11-30 | DRG: 951 | Disposition: E | Payer: Medicare Other | Attending: Family Medicine | Admitting: Family Medicine

## 2019-10-30 ENCOUNTER — Encounter (HOSPITAL_COMMUNITY): Payer: Self-pay | Admitting: Emergency Medicine

## 2019-10-30 ENCOUNTER — Other Ambulatory Visit: Payer: Self-pay

## 2019-10-30 ENCOUNTER — Emergency Department (HOSPITAL_COMMUNITY): Payer: Medicare Other

## 2019-10-30 DIAGNOSIS — Z8673 Personal history of transient ischemic attack (TIA), and cerebral infarction without residual deficits: Secondary | ICD-10-CM

## 2019-10-30 DIAGNOSIS — I10 Essential (primary) hypertension: Secondary | ICD-10-CM | POA: Diagnosis not present

## 2019-10-30 DIAGNOSIS — I639 Cerebral infarction, unspecified: Secondary | ICD-10-CM | POA: Diagnosis present

## 2019-10-30 DIAGNOSIS — Z85828 Personal history of other malignant neoplasm of skin: Secondary | ICD-10-CM | POA: Diagnosis not present

## 2019-10-30 DIAGNOSIS — Z20822 Contact with and (suspected) exposure to covid-19: Secondary | ICD-10-CM | POA: Diagnosis present

## 2019-10-30 DIAGNOSIS — I619 Nontraumatic intracerebral hemorrhage, unspecified: Secondary | ICD-10-CM | POA: Diagnosis not present

## 2019-10-30 DIAGNOSIS — R4182 Altered mental status, unspecified: Secondary | ICD-10-CM | POA: Diagnosis not present

## 2019-10-30 DIAGNOSIS — Z515 Encounter for palliative care: Principal | ICD-10-CM | POA: Diagnosis present

## 2019-10-30 DIAGNOSIS — I629 Nontraumatic intracranial hemorrhage, unspecified: Secondary | ICD-10-CM | POA: Diagnosis not present

## 2019-10-30 DIAGNOSIS — Z66 Do not resuscitate: Secondary | ICD-10-CM | POA: Diagnosis not present

## 2019-10-30 DIAGNOSIS — I615 Nontraumatic intracerebral hemorrhage, intraventricular: Secondary | ICD-10-CM | POA: Diagnosis not present

## 2019-10-30 DIAGNOSIS — I610 Nontraumatic intracerebral hemorrhage in hemisphere, subcortical: Secondary | ICD-10-CM | POA: Diagnosis not present

## 2019-10-30 DIAGNOSIS — Z8249 Family history of ischemic heart disease and other diseases of the circulatory system: Secondary | ICD-10-CM

## 2019-10-30 DIAGNOSIS — M1990 Unspecified osteoarthritis, unspecified site: Secondary | ICD-10-CM | POA: Diagnosis not present

## 2019-10-30 DIAGNOSIS — M8580 Other specified disorders of bone density and structure, unspecified site: Secondary | ICD-10-CM | POA: Diagnosis not present

## 2019-10-30 DIAGNOSIS — Z87891 Personal history of nicotine dependence: Secondary | ICD-10-CM

## 2019-10-30 DIAGNOSIS — N3281 Overactive bladder: Secondary | ICD-10-CM | POA: Diagnosis present

## 2019-10-30 DIAGNOSIS — Z7982 Long term (current) use of aspirin: Secondary | ICD-10-CM | POA: Diagnosis not present

## 2019-10-30 DIAGNOSIS — R404 Transient alteration of awareness: Secondary | ICD-10-CM | POA: Diagnosis not present

## 2019-10-30 DIAGNOSIS — Z96641 Presence of right artificial hip joint: Secondary | ICD-10-CM | POA: Diagnosis present

## 2019-10-30 DIAGNOSIS — E785 Hyperlipidemia, unspecified: Secondary | ICD-10-CM | POA: Diagnosis present

## 2019-10-30 DIAGNOSIS — I499 Cardiac arrhythmia, unspecified: Secondary | ICD-10-CM | POA: Diagnosis not present

## 2019-10-30 DIAGNOSIS — R001 Bradycardia, unspecified: Secondary | ICD-10-CM | POA: Diagnosis not present

## 2019-10-30 DIAGNOSIS — R4020 Unspecified coma: Secondary | ICD-10-CM | POA: Diagnosis not present

## 2019-10-30 DIAGNOSIS — R29818 Other symptoms and signs involving the nervous system: Secondary | ICD-10-CM | POA: Diagnosis not present

## 2019-10-30 LAB — I-STAT CHEM 8, ED
BUN: 14 mg/dL (ref 8–23)
Calcium, Ion: 1.1 mmol/L — ABNORMAL LOW (ref 1.15–1.40)
Chloride: 101 mmol/L (ref 98–111)
Creatinine, Ser: 0.6 mg/dL (ref 0.44–1.00)
Glucose, Bld: 126 mg/dL — ABNORMAL HIGH (ref 70–99)
HCT: 41 % (ref 36.0–46.0)
Hemoglobin: 13.9 g/dL (ref 12.0–15.0)
Potassium: 3.7 mmol/L (ref 3.5–5.1)
Sodium: 136 mmol/L (ref 135–145)
TCO2: 29 mmol/L (ref 22–32)

## 2019-10-30 LAB — CBC
HCT: 39.9 % (ref 36.0–46.0)
Hemoglobin: 12.9 g/dL (ref 12.0–15.0)
MCH: 30.1 pg (ref 26.0–34.0)
MCHC: 32.3 g/dL (ref 30.0–36.0)
MCV: 93 fL (ref 80.0–100.0)
Platelets: 212 10*3/uL (ref 150–400)
RBC: 4.29 MIL/uL (ref 3.87–5.11)
RDW: 13.2 % (ref 11.5–15.5)
WBC: 6.4 10*3/uL (ref 4.0–10.5)
nRBC: 0 % (ref 0.0–0.2)

## 2019-10-30 LAB — DIFFERENTIAL
Abs Immature Granulocytes: 0.01 10*3/uL (ref 0.00–0.07)
Basophils Absolute: 0 10*3/uL (ref 0.0–0.1)
Basophils Relative: 1 %
Eosinophils Absolute: 0.2 10*3/uL (ref 0.0–0.5)
Eosinophils Relative: 3 %
Immature Granulocytes: 0 %
Lymphocytes Relative: 18 %
Lymphs Abs: 1.2 10*3/uL (ref 0.7–4.0)
Monocytes Absolute: 0.8 10*3/uL (ref 0.1–1.0)
Monocytes Relative: 13 %
Neutro Abs: 4.2 10*3/uL (ref 1.7–7.7)
Neutrophils Relative %: 65 %

## 2019-10-30 LAB — COMPREHENSIVE METABOLIC PANEL
ALT: 19 U/L (ref 0–44)
AST: 32 U/L (ref 15–41)
Albumin: 3.6 g/dL (ref 3.5–5.0)
Alkaline Phosphatase: 53 U/L (ref 38–126)
Anion gap: 10 (ref 5–15)
BUN: 14 mg/dL (ref 8–23)
CO2: 24 mmol/L (ref 22–32)
Calcium: 8.8 mg/dL — ABNORMAL LOW (ref 8.9–10.3)
Chloride: 100 mmol/L (ref 98–111)
Creatinine, Ser: 0.62 mg/dL (ref 0.44–1.00)
GFR calc Af Amer: >60 mL/min (ref >60)
GFR calc non Af Amer: >60 mL/min (ref >60)
Glucose, Bld: 132 mg/dL — ABNORMAL HIGH (ref 70–99)
Potassium: 3.8 mmol/L (ref 3.5–5.1)
Sodium: 134 mmol/L — ABNORMAL LOW (ref 135–145)
Total Bilirubin: 0.6 mg/dL (ref 0.3–1.2)
Total Protein: 5.8 g/dL — ABNORMAL LOW (ref 6.5–8.1)

## 2019-10-30 LAB — CBG MONITORING, ED: Glucose-Capillary: 116 mg/dL — ABNORMAL HIGH (ref 70–99)

## 2019-10-30 LAB — PROTIME-INR
INR: 1 (ref 0.8–1.2)
Prothrombin Time: 13.2 seconds (ref 11.4–15.2)

## 2019-10-30 LAB — APTT: aPTT: 29 seconds (ref 24–36)

## 2019-10-30 MED ORDER — LORAZEPAM 2 MG/ML IJ SOLN: 1.0000 mg | INTRAMUSCULAR | Status: DC | PRN

## 2019-10-30 MED ORDER — MORPHINE SULFATE (PF) 2 MG/ML IV SOLN
2.0000 mg | Freq: Once | INTRAVENOUS | Status: AC
  Administered 2019-10-30: 2 mg via INTRAVENOUS
  Filled 2019-10-30: qty 1

## 2019-10-30 MED ORDER — MORPHINE SULFATE (PF) 2 MG/ML IV SOLN: 2.0000 mg | INTRAVENOUS | Status: DC | PRN

## 2019-10-30 MED ORDER — SODIUM CHLORIDE 0.9% FLUSH
3.0000 mL | Freq: Once | INTRAVENOUS | Status: AC
  Administered 2019-10-31: 04:00:00 3 mL via INTRAVENOUS

## 2019-10-30 NOTE — Code Documentation (Signed)
Responded to Code Stroke called at 2054. EMS was notified by a fall detection company that a fall was detected. When they arrived, they found pt on floor, unresponsive, and posturing. [id number] when a neighbor spoke with her about food. Pt arrived at 2101. L pupil 6 and R 2, both unreactive. NIH-29, CBG-126. CT head-Massive intraparenchymal hemorrhage on the left with the epicenter in the thalamus/basal ganglia region measuring 10 x 7 x 6cm (volume approximately 200 cc) surrounding edema. Sub ependymal and intraventricular extension. Left-to-right midline shift 2 cm. Family notified by EDP and Neuro-made DNR/DNI. Family on their way to see pt.

## 2019-10-30 NOTE — ED Triage Notes (Signed)
Patient was found by EMS after a fall, a fall detection company that the patient has called EMS.  Patient was found on floor with decerebrate posturing, unresponsive.  Patient was last seen normal at 1730 when she spoke to neighbor about dinner.  Patient does react to pain in all four extremities, snoring resps.

## 2019-10-30 NOTE — Consult Note (Signed)
NEURO HOSPITALIST CONSULT NOTE   Requesting physician: Dr. Roslynn Amble  Reason for Consult: Acute onset of unresponsiveness  History obtained from:  EMS, Daughter and Chart    HPI:                                                                                                                                          [name] is an 86 y.o. female presenting to the ED unresponsive as a Code Stroke via EMS. EMS was called to her home after her wearable medical alert system automatically registered a fall. On arrival, she was on the floor supine next to her recliner, unresponsive with decerebrate posturing. She apparently had been eating some applesauce which was still cold, from which EMS surmised that she had been functioning normally within a short time period prior to the medical alert system being triggered. She was last seen normal at 1730 when she spoke to a neighbor about dinner. She had snoring respirations on arrival. Pupils were reactive and equal on scene per EMS. On arrival to the ED pupils were asymmetric, left unreactive, right sluggishly reactive. After CT, both pupils were noted to be unreactive. Vomitus was noted on the patient's clothing.   PMHx includes stroke, HLD and HTN. Takes ASA at home. Not on an anticoagulant.   Past Medical History:  Diagnosis Date  . Arthritis   . Cancer ([place])    skin cancer   . CVA (cerebral infarction)   . Hyperlipemia   . Left patella fracture   . Osteopenia   . Overactive bladder   HTN per daughter  Past Surgical History:  Procedure Laterality Date  . ANKLE FRACTURE SURGERY     rt  . GREAT TOE ARTHRODESIS, INTERPHALANGEAL JOINT     rt  . plate in left wrist for fracture     . tarsal tunnel surgery    . TONSILLECTOMY AND ADENOIDECTOMY    . TOTAL HIP ARTHROPLASTY Right 09/21/2014   Procedure: RIGHT TOTAL HIP ARTHROPLASTY ANTERIOR APPROACH;  Surgeon: Mauri Pole, MD;  Location: [facility];  Service: Orthopedics;   Laterality: Right;    Family History  Problem Relation Age of Onset  . Heart disease Mother               Social History:  reports that she quit smoking about 39 years ago. Her smoking use included cigarettes. She has never used smokeless tobacco. She reports current alcohol use. She reports that she does not use drugs.  No Known Allergies  HOME MEDICATIONS:  No current facility-administered medications on file prior to encounter.   Current Outpatient Medications on File Prior to Encounter  Medication Sig Dispense Refill  . denosumab (PROLIA) 60 MG/ML SOLN injection Inject 60 mg into the skin every 6 (six) months. Administer in upper arm, thigh, or abdomen    . docusate sodium 100 MG CAPS Take 100 mg by mouth 2 (two) times daily. 10 capsule 0  . Multiple Vitamin (MULTIVITAMIN) tablet Take 1 tablet by mouth daily.      . simvastatin (ZOCOR) 20 MG tablet Take 20 mg by mouth every evening.       ROS:                                                                                                                                       Unable to obtain due to coma.    There were no vitals taken for this visit.   General Examination:                                                                                                      Physical Exam  HEENT-  Normocephalic. No visible signs of head trauma. Lungs- Sonorous respirations Extremities- No edema  Neurological Examination Mental Status: Unresponsive with eyes remaining open after passive elevation of eyelids. Decerebrate posturing of BUE to noxious. Flexion of BLE to noxious. Murmurs slightly to noxious, otherwise no vocalizations. Does not respond to any verbal or other auditory stimuli.  Cranial Nerves: II: No blink to threat bilaterally. Right pupil 4 mm and unreactive. Left pupil 6 mm and unreactive. Does not  gaze towards or away from visual stimuli.  III,IV, VI: Eyes conjugately at the midline. No movement to any stimuli. No nystagmus.  V,VII: Face grossly symmetric. Does not grimace to noxious.  VIII: No response to auditory stimuli IX,X: Sonorous respirations. Unable to visualize palate.  XI: Unable to assess XII: Unable to assess Motor/Sensory: Increased extensor tone BUE, right > left. Decerebrate posturing of BUE to noxious, right worse than left.  Withdrawal of BLE to noxious plantar stimulation.  Deep Tendon Reflexes: 1+ bilateral brachioradialis, biceps and patellae.  Plantars: Mute bilaterally  Cerebellar/Gait: Unable to assess   Lab Results: Basic Metabolic Panel: Recent Labs  Lab 11/15/2019 2122  NA 136  K 3.7  CL 101  GLUCOSE 126*  BUN 14  CREATININE 0.60    CBC: Recent Labs  Lab 11/01/2019 2113 11/12/2019 2122  WBC 6.4  --   NEUTROABS 4.2  --  HGB 12.9 13.9  HCT 39.9 41.0  MCV 93.0  --   PLT 212  --     Cardiac Enzymes: No results for input(s): CKTOTAL, CKMB, CKMBINDEX, TROPONINI in the last 168 hours.  Lipid Panel: No results for input(s): CHOL, TRIG, HDL, CHOLHDL, VLDL, LDLCALC in the last 168 hours.  Imaging: CT HEAD CODE STROKE WO CONTRAST  Result Date: 11/16/2019 CLINICAL DATA:  Code stroke. Unresponsive. Intracranial hemorrhage suspected. EXAM: CT HEAD WITHOUT CONTRAST TECHNIQUE: Contiguous axial images were obtained from the base of the skull through the vertex without intravenous contrast. COMPARISON:  07/07/2011 FINDINGS: Brain: 10 x 7 x 6 cm intraparenchymal hemorrhage with the epicenter in the left thalamus or basal ganglia. Subependymal extension of hemorrhage along the ventricular system. Marked mass effect with left-to-right shift of 2 cm. Small amount of free intraventricular blood. Elsewhere, the brain shows age related atrophy and chronic small-vessel ischemic changes with old infarction in the right basal ganglia. Vascular: There is  atherosclerotic calcification of the major vessels at the base of the brain. Skull: Negative Sinuses/Orbits: Chronic sinusitis of the left sphenoid. Other sinuses clear. Orbits negative. Other: None IMPRESSION: 1. Massive intraparenchymal hemorrhage on the left with the epicenter in the thalamus/basal ganglia region measuring 10 x 7 x 6 cm (volume approximately 200 cc) surrounding edema. Sub ependymal and intraventricular extension. Left-to-right midline shift 2 cm. These results were communicated to Dr. Cheral Marker at [place] 01/03/2021by text page via the [facility] messaging system. Electronically Signed   By: Nelson Chimes M.D.   On: 11/18/2019 21:23    Assessment: 86 year old female presenting with massive left cerebral hemisphere acute hemorrhage with intraventricular extension. There is significant mass effect with left to right midline shift.  1. Exam reveals decorticate posturing of upper extremities to stimulation. BLE will withdraw to noxious and patient will moan slightly, but otherwise with no responses to external stimuli. Eyes at the midline with asymmetric, unreactive pupils.  2. The patient is DNR per paperwork brought by EMS.  3. The hemorrhage does not appear to be survivable. Neurosurgical intervention would not provide additional survival benefit.   Recommendations: 1. Discussed prognosis with the patient's daughter over the telephone. She does not want to reverse the DNR order. She expressed desire for no intubation. Comfort measures were discussed and daughter is amenable to use of opiate medications to decrease signs of pain. She agrees with supplemental O2 via NRB mask.   2. The patient's daughter expressed desire to see her mother at the bedside as soon as possible. She is en route to the [facility] ED.  3. If daughter wishes to discuss condition/prognosis with Neurology after she arrives to the ED, please call us.   Electronically signed: Dr. Kerney Elbe  10/30/2019, 9:27 PM

## 2019-10-30 NOTE — ED Notes (Signed)
Family at bedside with patient.

## 2019-10-30 NOTE — ED Provider Notes (Signed)
[facility] EMERGENCY DEPARTMENT Provider Note   CSN: [id number] Arrival date & time: 11/12/2019  2101  An emergency department physician performed an initial assessment on this suspected stroke patient at 2104.  History Chief Complaint  Patient presents with   Code Stroke    [name] is a 86 y.o. female.  Presents to ER after found down.  Code stroke.  History limited due to patient's altered mental status.  Daughter reports patient was last seen around 79 when she had talked to a neighbor about dinner.  Otherwise had been feeling well today.  History unobtainable from patient as she is somnolent.  Level 5 caveat.  HPI     Past Medical History:  Diagnosis Date   Arthritis    Cancer ([place])    skin cancer    CVA (cerebral infarction)    Hyperlipemia    Left patella fracture    Osteopenia    Overactive bladder     Patient Active Problem List   Diagnosis Date Noted   Intracranial bleed ([place]) 11/26/2019   S/P right THA, AA 09/21/2014   Stroke ([place]) 03/07/2012   Edema 02/28/2011    Past Surgical History:  Procedure Laterality Date   ANKLE FRACTURE SURGERY     rt   GREAT TOE ARTHRODESIS, INTERPHALANGEAL JOINT     rt   plate in left wrist for fracture      tarsal tunnel surgery     TONSILLECTOMY AND ADENOIDECTOMY     TOTAL HIP ARTHROPLASTY Right 09/21/2014   Procedure: RIGHT TOTAL HIP ARTHROPLASTY ANTERIOR APPROACH;  Surgeon: Mauri Pole, MD;  Location: [facility];  Service: Orthopedics;  Laterality: Right;     OB History   No obstetric history on file.     Family History  Problem Relation Age of Onset   Heart disease Mother     Social History   Tobacco Use   Smoking status: Former Smoker    Types: Cigarettes    Quit date: 10/29/1980    Years since quitting: 39.0   Smokeless tobacco: Never Used   Tobacco comment: 10-30-1979  Substance Use Topics   Alcohol use: Yes    Comment: occasional glass of wine     Drug use: No    Home Medications Prior to Admission medications   Medication Sig Start Date End Date Taking? Authorizing Provider  denosumab (PROLIA) 60 MG/ML SOLN injection Inject 60 mg into the skin every 6 (six) months. Administer in upper arm, thigh, or abdomen    [provider]  docusate sodium 100 MG CAPS Take 100 mg by mouth 2 (two) times daily. 09/24/14   Danae Orleans, PA-C  Multiple Vitamin (MULTIVITAMIN) tablet Take 1 tablet by mouth daily.      [provider]  simvastatin (ZOCOR) 20 MG tablet Take 20 mg by mouth every evening.    [provider]    Allergies    Patient has no known allergies.  Review of Systems   Review of Systems  Unable to perform ROS: Mental status change    Physical Exam Updated Vital Signs BP (!) 146/55    Pulse (!) 58    Resp 20    SpO2 100%   Physical Exam Constitutional:      Comments: Unresponsive, lying in bed  HENT:     Head: Normocephalic and atraumatic.     Nose: Nose normal.     Mouth/Throat:     Mouth: Mucous membranes are moist.  Eyes:  Comments: Left pupil 6 mm, fixed, right pupil is reactive  Cardiovascular:     Rate and Rhythm: Normal rate and regular rhythm.  Pulmonary:     Comments: Poor respiratory effort bilaterally, nonrebreather Abdominal:     General: Abdomen is flat.     Palpations: Abdomen is soft.  Musculoskeletal:        General: No swelling or tenderness.  Skin:    General: Skin is warm and dry.     Capillary Refill: Capillary refill takes less than 2 seconds.  Neurological:     GCS: GCS eye subscore is 1. GCS verbal subscore is 1. GCS motor subscore is 2.     Comments: Extensor posturing in upper extremities, no response to pain in lower extremities     ED Results / Procedures / Treatments   Labs (all labs ordered are listed, but only abnormal results are displayed) Labs Reviewed  COMPREHENSIVE METABOLIC PANEL - Abnormal; Notable for the following components:       Result Value   Sodium 134 (*)    Glucose, Bld 132 (*)    Calcium 8.8 (*)    Total Protein 5.8 (*)    All other components within normal limits  I-STAT CHEM 8, ED - Abnormal; Notable for the following components:   Glucose, Bld 126 (*)    Calcium, Ion 1.10 (*)    All other components within normal limits  CBG MONITORING, ED - Abnormal; Notable for the following components:   Glucose-Capillary 116 (*)    All other components within normal limits  SARS CORONAVIRUS 2 (TAT 6-24 HRS)  PROTIME-INR  APTT  CBC  DIFFERENTIAL    EKG None  Radiology CT HEAD CODE STROKE WO CONTRAST  Result Date: 11/05/2019 CLINICAL DATA:  Code stroke. Unresponsive. Intracranial hemorrhage suspected. EXAM: CT HEAD WITHOUT CONTRAST TECHNIQUE: Contiguous axial images were obtained from the base of the skull through the vertex without intravenous contrast. COMPARISON:  07/07/2011 FINDINGS: Brain: 10 x 7 x 6 cm intraparenchymal hemorrhage with the epicenter in the left thalamus or basal ganglia. Subependymal extension of hemorrhage along the ventricular system. Marked mass effect with left-to-right shift of 2 cm. Small amount of free intraventricular blood. Elsewhere, the brain shows age related atrophy and chronic small-vessel ischemic changes with old infarction in the right basal ganglia. Vascular: There is atherosclerotic calcification of the major vessels at the base of the brain. Skull: Negative Sinuses/Orbits: Chronic sinusitis of the left sphenoid. Other sinuses clear. Orbits negative. Other: None IMPRESSION: 1. Massive intraparenchymal hemorrhage on the left with the epicenter in the thalamus/basal ganglia region measuring 10 x 7 x 6 cm (volume approximately 200 cc) surrounding edema. Sub ependymal and intraventricular extension. Left-to-right midline shift 2 cm. These results were communicated to Dr. Cheral Marker at [place] 01/09/2021by text page via the [facility] messaging system. Electronically Signed   By: Nelson Chimes  M.D.   On: 10/30/2019 21:23    Procedures .Critical Care Performed by: Lucrezia Starch, MD Authorized by: Lucrezia Starch, MD   Critical care provider statement:    Critical care time (minutes):  45   Critical care was necessary to treat or prevent imminent or life-threatening deterioration of the following conditions:  CNS failure or compromise   Critical care was time spent personally by me on the following activities:  Discussions with consultants, evaluation of patient's response to treatment, examination of patient, ordering and performing treatments and interventions, ordering and review of laboratory studies, ordering and review of  radiographic studies, pulse oximetry, re-evaluation of patient's condition, obtaining history from patient or surrogate and review of old charts   (including critical care time)  Medications Ordered in ED Medications  sodium chloride flush (NS) 0.9 % injection 3 mL (has no administration in time range)  morphine 2 MG/ML injection 2 mg (has no administration in time range)  LORazepam (ATIVAN) injection 1 mg (has no administration in time range)  morphine 2 MG/ML injection 2 mg (has no administration in time range)    ED Course  I have reviewed the triage vital signs and the nursing notes.  Pertinent labs & imaging results that were available during my care of the patient were reviewed by me and considered in my medical decision making (see chart for details).  Clinical Course as of Oct 30 2319  Fri Oct 30, 2019  2108 At bedside on arrival, posturing, blown pupil, concern for bleed or massive stroke, neurology at bedside in CT   [RD]  2124 Discussed with patient's daughter, confirms DNR/DNI, shared CT results, agreeable to proceed with comfort measures only at this time   [RD]  2215 Rechecked patient, still breathing spontaneously, will consult hospitalist for admission   [RD]    Clinical Course User Index [RD] Lucrezia Starch, MD    MDM Rules/Calculators/A&P                     86 year old lady presents to ER as code stroke.  On exam patient noted to have fixed and dilated pupil, extensor posturing.  CT head concerning for massive hemorrhagic stroke.  Patient already established as DNR/DNI.  Discussed goals of care with patient's daughter, conveyed for prognosis given CT findings and neurology assessment, proceed with comfort measures only.  Admitted to hospitalist service for further palliative care. Dr. Hal Hope will accept.      Final Clinical Impression(s) / ED Diagnoses Final diagnoses:  Hemorrhagic stroke [facility])    Rx / DC Orders ED Discharge Orders    None       Lucrezia Starch, MD 11/07/2019 2321

## 2019-10-31 ENCOUNTER — Other Ambulatory Visit: Payer: Self-pay

## 2019-10-31 ENCOUNTER — Encounter (HOSPITAL_COMMUNITY): Payer: Self-pay | Admitting: Internal Medicine

## 2019-10-31 DIAGNOSIS — I629 Nontraumatic intracranial hemorrhage, unspecified: Secondary | ICD-10-CM

## 2019-10-31 DIAGNOSIS — I10 Essential (primary) hypertension: Secondary | ICD-10-CM | POA: Diagnosis present

## 2019-10-31 DIAGNOSIS — E785 Hyperlipidemia, unspecified: Secondary | ICD-10-CM | POA: Diagnosis present

## 2019-10-31 DIAGNOSIS — Z66 Do not resuscitate: Secondary | ICD-10-CM | POA: Diagnosis present

## 2019-10-31 LAB — SARS CORONAVIRUS 2 (TAT 6-24 HRS): SARS Coronavirus 2: NEGATIVE

## 2019-10-31 MED ORDER — ONDANSETRON HCL 4 MG PO TABS: 4.0000 mg | ORAL_TABLET | Freq: Four times a day (QID) | ORAL | Status: DC | PRN

## 2019-10-31 MED ORDER — ACETAMINOPHEN 650 MG RE SUPP: 650.0000 mg | Freq: Four times a day (QID) | RECTAL | Status: DC | PRN

## 2019-10-31 MED ORDER — ONDANSETRON HCL 4 MG/2ML IJ SOLN: 4.0000 mg | Freq: Four times a day (QID) | INTRAMUSCULAR | Status: DC | PRN

## 2019-10-31 MED ORDER — MORPHINE 100MG IN NS 100ML (1MG/ML) PREMIX INFUSION
1.0000 mg/h | INTRAVENOUS | Status: DC
  Administered 2019-10-31: 1 mg/h via INTRAVENOUS
  Filled 2019-10-31: qty 100

## 2019-10-31 MED ORDER — ACETAMINOPHEN 325 MG PO TABS: 650.0000 mg | ORAL_TABLET | Freq: Four times a day (QID) | ORAL | Status: DC | PRN

## 2019-10-31 MED ORDER — ORAL CARE MOUTH RINSE
15.0000 mL | Freq: Two times a day (BID) | OROMUCOSAL | Status: DC
  Administered 2019-10-31: 15 mL via OROMUCOSAL

## 2019-11-30 NOTE — Care Management Note (Addendum)
HPI OF. Dr. Hal Hope HPI: [name] is a 86 y.o. female with history of possible recent stroke 2 months ago which was managed with ongoing home per the patient's daughter at that time patient was noted to have some facial drooping was found to be unresponsive by EMS after patient's life alert notified them.  Per report patient's unresponsive episode have been very sudden and recent just few minutes before EMS could have arrived.  Patient was brought to the ER as a code stroke. Patient was admitted early hours of this morning 11/15/2019 Patient is a poor prognosis with massive left cerebral hemorrhage and stroke.  Patient is currently comfort measures only, patient is DNR

## 2019-11-30 NOTE — Progress Notes (Signed)
MEWS score of 2, not an acute change, pt on morphine gtt.

## 2019-11-30 NOTE — Death Summary Note (Addendum)
DEATH SUMMARY   Patient Details  Name: [name] MRN: [id number] DOB: 02/26/1928  Admission/Discharge Information   Admit Date:  2019-11-16  Date of Death: Date of Death: 2019/11/17  Time of Death: Time of Death: 06-Feb-1438  Length of Stay: 1  Referring Physician: Orpah Melter, MD   Reason(s) for Hospitalization  Stroke and Fall  Diagnoses  Preliminary cause of death: Basal ganglia stroke ([place]) Secondary Diagnoses (including complications and co-morbidities):  Principal Problem:   Intracranial bleed ([place]) Active Problems:   DNR (do not resuscitate) Left cerebral stroke Large left cerebral hemorrhage Hypertension Hyperlipidemia  Brief Hospital Course (including significant findings, care, treatment, and services provided and events leading to death)  [name] is a 86 y.o. year old female who was was found unresponsive in her home when EMS arrived.  Patient with past medical history of recent stroke 2 months ago, hypertension and hyperlipidemia.  She takes aspirin but no anticoagulation she also had history of osteopenia on Prolia.  On arrival to the emergency department CT scan of the head showed large left cerebral hemorrhage extending into the ventricles with midline shift from left to right.  In the emergency room after CT she was noted to be in the decorticate position with pupils bilaterally nonreactive.  She was made comfort measures on admission.  Neurology was consulted who did not think this was amenable to surgical intervention and that the hemorrhage did not appear to be survivable.  She was made comfort measures with O2 nonrebreather mask and morphine drip.  Patient presented to the emergency department on November 16, 2019 at about 9:26 PM and was pronounced dead on 11-17-19 at 2:39 PM    Pertinent Labs and Studies  Significant Diagnostic Studies CT HEAD CODE STROKE WO CONTRAST  Result Date: 11/16/2019 CLINICAL DATA:  Code stroke. Unresponsive. Intracranial  hemorrhage suspected. EXAM: CT HEAD WITHOUT CONTRAST TECHNIQUE: Contiguous axial images were obtained from the base of the skull through the vertex without intravenous contrast. COMPARISON:  07/07/2011 FINDINGS: Brain: 10 x 7 x 6 cm intraparenchymal hemorrhage with the epicenter in the left thalamus or basal ganglia. Subependymal extension of hemorrhage along the ventricular system. Marked mass effect with left-to-right shift of 2 cm. Small amount of free intraventricular blood. Elsewhere, the brain shows age related atrophy and chronic small-vessel ischemic changes with old infarction in the right basal ganglia. Vascular: There is atherosclerotic calcification of the major vessels at the base of the brain. Skull: Negative Sinuses/Orbits: Chronic sinusitis of the left sphenoid. Other sinuses clear. Orbits negative. Other: None IMPRESSION: 1. Massive intraparenchymal hemorrhage on the left with the epicenter in the thalamus/basal ganglia region measuring 10 x 7 x 6 cm (volume approximately 200 cc) surrounding edema. Sub ependymal and intraventricular extension. Left-to-right midline shift 2 cm. These results were communicated to [name] at [facility] 01-18-2021by text page via the [facility] messaging system. Electronically Signed   By: [name] M.D.   On: 11-16-19 21:23    Microbiology Recent Results (from the past 240 hour(s))  SARS CORONAVIRUS 2 (TAT 6-24 HRS) Nasopharyngeal Nasopharyngeal Swab     Status: None   Collection Time: November 16, 2019 11:55 PM   Specimen: Nasopharyngeal Swab  Result Value Ref Range Status   SARS Coronavirus 2 NEGATIVE NEGATIVE Final    Comment: (NOTE) SARS-CoV-2 target nucleic acids are NOT DETECTED. The SARS-CoV-2 RNA is generally detectable in upper and lower respiratory specimens during the acute phase of infection. Negative results do not preclude SARS-CoV-2 infection,  do not rule out co-infections with other pathogens, and should not be used as the sole basis for  treatment or other patient management decisions. Negative results must be combined with clinical observations, patient history, and epidemiological information. The expected result is Negative. Fact Sheet for Patients: SugarRoll.be Fact Sheet for Healthcare Providers: https://www.woods-mathews.com/ This test is not yet approved or cleared by the [place] FDA and  has been authorized for detection and/or diagnosis of SARS-CoV-2 by FDA under an Emergency Use Authorization (EUA). This EUA will remain  in effect (meaning this test can be used) for the duration of the COVID-19 declaration under Section 56 4(b)(1) of the Act, 21 U.S.C. section 360bbb-3(b)(1), unless the authorization is terminated or revoked sooner. Performed at [facility], [place] [address], [place], [place] [address]     Lab Basic Metabolic Panel: Recent Labs  Lab 11/01/2019 2113 11/04/2019 2122  NA 134* 136  K 3.8 3.7  CL 100 101  CO2 24  --   GLUCOSE 132* 126*  BUN 14 14  CREATININE 0.62 0.60  CALCIUM 8.8*  --    Liver Function Tests: Recent Labs  Lab 11/04/2019 2113  AST 32  ALT 19  ALKPHOS 53  BILITOT 0.6  PROT 5.8*  ALBUMIN 3.6   No results for input(s): LIPASE, AMYLASE in the last 168 hours. No results for input(s): AMMONIA in the last 168 hours. CBC: Recent Labs  Lab 11/16/2019 2113 11/23/2019 2122  WBC 6.4  --   NEUTROABS 4.2  --   HGB 12.9 13.9  HCT 39.9 41.0  MCV 93.0  --   PLT 212  --    Cardiac Enzymes: No results for input(s): CKTOTAL, CKMB, CKMBINDEX, TROPONINI in the last 168 hours. Sepsis Labs: Recent Labs  Lab 11/13/2019 2113  WBC 6.4    Procedures/Operations  None   [name] 11/17/2019, 4:17 PM

## 2019-11-30 NOTE — Progress Notes (Signed)
STROKE TEAM PROGRESS NOTE   HISTORY OF PRESENT ILLNESS (per record) [name] is an 86 y.o. female presenting to the ED unresponsive as a Code Stroke via EMS. EMS was called to her home after her wearable medical alert system automatically registered a fall. On arrival, she was on the floor supine next to her recliner, unresponsive with decerebrate posturing. She apparently had been eating some applesauce which was still cold, from which EMS surmised that she had been functioning normally within a short time period prior to the medical alert system being triggered. She was last seen normal at 1730 when she spoke to a neighbor about dinner. She had snoring respirations on arrival. Pupils were reactive and equal on scene per EMS. On arrival to the ED pupils were asymmetric, left unreactive, right sluggishly reactive. After CT, both pupils were noted to be unreactive. Vomitus was noted on the patient's clothing.   PMHx includes stroke, HLD and HTN. Takes ASA at home. Not on an anticoagulant.    INTERVAL HISTORY Her family is not at bedside.  Patient is currently comfort measures only, patient is DNR    OBJECTIVE Vitals:   11/09/2019 0000 11-09-19 0100 Nov 09, 2019 0151 11/09/2019 0813  BP: 140/64 (!) 144/89 (!) 164/60 (!) 153/60  Pulse: (!) 56 64 67 75  Resp: 19 20 20 18   Temp:   97.8 F (36.6 C) 98.5 F (36.9 C)  TempSrc:   Oral Axillary  SpO2: 100% 99% 99% 100%  Weight:   58.8 kg   Height:   5\' 3"  (1.6 m)     CBC:  Recent Labs  Lab 11/15/2019 2113 11/23/2019 2122  WBC 6.4  --   NEUTROABS 4.2  --   HGB 12.9 13.9  HCT 39.9 41.0  MCV 93.0  --   PLT 212  --     Basic Metabolic Panel:  Recent Labs  Lab 11/08/2019 2113 11/13/2019 2122  NA 134* 136  K 3.8 3.7  CL 100 101  CO2 24  --   GLUCOSE 132* 126*  BUN 14 14  CREATININE 0.62 0.60  CALCIUM 8.8*  --     Lipid Panel:     Component Value Date/Time   CHOL 215 (H) 07/08/2011 0540   TRIG 86 07/08/2011 0540   HDL 76 07/08/2011  0540   CHOLHDL 2.8 07/08/2011 0540   VLDL 17 07/08/2011 0540   LDLCALC 122 (H) 07/08/2011 0540   HgbA1c:  Lab Results  Component Value Date   HGBA1C 5.5 07/08/2011   Urine Drug Screen: No results found for: LABOPIA, COCAINSCRNUR, LABBENZ, AMPHETMU, THCU, LABBARB  Alcohol Level No results found for: ETH  IMAGING   CT HEAD CODE STROKE WO CONTRAST 11/27/2019 IMPRESSION:  Massive intraparenchymal hemorrhage on the left with the epicenter in the thalamus/basal ganglia region measuring 10 x 7 x 6 cm (volume approximately 200 cc) surrounding edema. Sub ependymal and intraventricular extension. Left-to-right midline shift 2 cm.       Transthoracic Echocardiogram  00/00/2020 Pending    Bilateral Carotid Dopplers  00/00/2020 Pending   ECG - SR rate BPM. (See cardiology reading for complete details)   EEG    PHYSICAL EXAM Blood pressure (!) 153/60, pulse 75, temperature 98.5 F (36.9 C), temperature source Axillary, resp. rate 18, height 5\' 3"  (1.6 m), weight 58.8 kg, SpO2 100 %.    Patient is currently comfort measures only, patient is DNR   ASSESSMENT/PLAN Ms. [name] is a 86 y.o. female with history stroke, HLD  and HTN who was found down, unresponsive, decerebrate posturing, having vomited, unreactive pupils after CT . She did not receive IV t-PA due to [place].  Stroke: Massive left intraparenchymal hemorrhage   Code Stroke CT Head - Massive intraparenchymal hemorrhage on the left with the epicenter in the thalamus/basal ganglia region measuring 10 x 7 x 6 cm (volume approximately 200 cc) surrounding edema. Sub ependymal and intraventricular extension. Left-to-right midline shift 2 cm.   CT head - not ordered  MRI head - not ordered  MRA head - not ordered  CTA H&N - not ordered  CT Perfusion - not ordered  Carotid Doppler - not indicated  2D Echo - not indicated  [employer] Virus 2 - negative  LDL - 122  HgbA1c - not ordered  UDS - not  ordered  VTE prophylaxis - SCDs Diet  Diet Order            Diet NPO time specified  Diet effective now              No antithrombotic prior to admission, now on No antithrombotic  Disposition:  Pending  Hypertension  Home BP meds: none   Current BP meds: none  Stable . Long-term BP goal normotensive  Hyperlipidemia  Home Lipid lowering medication: none   LDL 122, goal < 70  Current lipid lowering medication: none   Continue statin at discharge   Other Stroke Risk Factors  Advanced age  Former cigarette smoker - quit  ETOH use, advised to drink no more than 1 alcoholic beverage per day.  Hx stroke/TIA   Other Active Problems  DNR  Pt is now comfort care   Hospital day # 1   Patient is currently comfort measures only, patient is DNR. Stroke team will sign off.   Personally examined patient and images, and have participated in and made any corrections needed to history, physical, neuro exam,assessment and plan as stated above.  I have personally obtained the history, evaluated lab date, reviewed imaging studies and agree with radiology interpretations.    Sarina Ill, MD Stroke Neurology   A total of 15 minutes was spent for the care of this patient, spent on counseling patient and family on different diagnostic and therapeutic options, counseling and coordination of care, riskd ans benefits of management, compliance, or risk factor reduction and education.   To contact Stroke Continuity provider, please refer to [email or web address] After hours, contact General Neurology

## 2019-11-30 NOTE — H&P (Signed)
History and Physical    [name] [id number] DOB: [birth year] DOA: 11/27/2019  PCP: Orpah Melter, MD  Patient coming from: Home.  History obtained from patient's daughter.  Chief Complaint: Unresponsiveness.  HPI: [name] is a 86 y.o. female with history of possible recent stroke 2 months ago which was managed with ongoing home per the patient's daughter at that time patient was noted to have some facial drooping was found to be unresponsive by EMS after patient's life alert notified them.  Per report patient's unresponsive episode have been very sudden and recent just few minutes before EMS could have arrived.  Patient was brought to the ER as a code stroke.  ED Course: CT of the head shows large left cerebral hemorrhage extending into the ventricles.  Patient was in a decorticate position with pupils are reacting and patient has minimal response.  Given the large intracerebral hemorrhage with patient minimal response neurologist at this time as discussed with patient's daughter at this time plan is to make patient complete comfort measures only.  I also discussed with patient's daughter and I have confirmed her comfort measures only with the daughter and starting patient on morphine drip.  Review of Systems: As per HPI, rest all negative.   Past Medical History:  Diagnosis Date  . Arthritis   . Cancer ([place])    skin cancer   . CVA (cerebral infarction)   . Hyperlipemia   . Left patella fracture   . Osteopenia   . Overactive bladder     Past Surgical History:  Procedure Laterality Date  . ANKLE FRACTURE SURGERY     rt  . GREAT TOE ARTHRODESIS, INTERPHALANGEAL JOINT     rt  . plate in left wrist for fracture     . tarsal tunnel surgery    . TONSILLECTOMY AND ADENOIDECTOMY    . TOTAL HIP ARTHROPLASTY Right 09/21/2014   Procedure: RIGHT TOTAL HIP ARTHROPLASTY ANTERIOR APPROACH;  Surgeon: Mauri Pole, MD;  Location: [facility];  Service: Orthopedics;  Laterality:  Right;     reports that she quit smoking about 39 years ago. Her smoking use included cigarettes. She has never used smokeless tobacco. She reports current alcohol use. She reports that she does not use drugs.  No Known Allergies  Family History  Problem Relation Age of Onset  . Heart disease Mother     Prior to Admission medications   Medication Sig Start Date End Date Taking? Authorizing Provider  denosumab (PROLIA) 60 MG/ML SOLN injection Inject 60 mg into the skin every 6 (six) months. Administer in upper arm, thigh, or abdomen    [provider]  docusate sodium 100 MG CAPS Take 100 mg by mouth 2 (two) times daily. 09/24/14   Danae Orleans, PA-C  Multiple Vitamin (MULTIVITAMIN) tablet Take 1 tablet by mouth daily.      [provider]  simvastatin (ZOCOR) 20 MG tablet Take 20 mg by mouth every evening.    [provider]    Physical Exam: Constitutional: Moderately built and nourished. Vitals:   11/05/2019 2245 11/26/2019 2330 11-24-2019 0000 11-24-19 0100  BP: (!) 146/55 (!) 136/57 140/64 (!) 144/89  Pulse: (!) 58 68 (!) 56 64  Resp: 20 (!) 21 19 20   SpO2: 100% 100% 100% 99%   Eyes: Anicteric no pallor. ENMT: No discharge from the ears eyes nose or mouth. Neck: No mass or.  No neck rigidity. Respiratory: No rhonchi or crepitations. Cardiovascular: S1-S2 heard. Abdomen: Soft nontender  bowel sounds present. Musculoskeletal: No edema. Skin: No rash. Neurologic: Not responsive pupils not reacting. Psychiatric: Not responsive.   Labs on Admission: I have personally reviewed following labs and imaging studies  CBC: Recent Labs  Lab 11/23/2019 2113 11/12/2019 2122  WBC 6.4  --   NEUTROABS 4.2  --   HGB 12.9 13.9  HCT 39.9 41.0  MCV 93.0  --   PLT 212  --    Basic Metabolic Panel: Recent Labs  Lab 11/01/2019 2113 11/27/2019 2122  NA 134* 136  K 3.8 3.7  CL 100 101  CO2 24  --   GLUCOSE 132* 126*  BUN 14 14  CREATININE 0.62 0.60  CALCIUM  8.8*  --    GFR: CrCl cannot be calculated (Unknown ideal weight.). Liver Function Tests: Recent Labs  Lab 11/03/2019 2113  AST 32  ALT 19  ALKPHOS 53  BILITOT 0.6  PROT 5.8*  ALBUMIN 3.6   No results for input(s): LIPASE, AMYLASE in the last 168 hours. No results for input(s): AMMONIA in the last 168 hours. Coagulation Profile: Recent Labs  Lab 11/27/2019 2113  INR 1.0   Cardiac Enzymes: No results for input(s): CKTOTAL, CKMB, CKMBINDEX, TROPONINI in the last 168 hours. BNP (last 3 results) No results for input(s): PROBNP in the last 8760 hours. HbA1C: No results for input(s): HGBA1C in the last 72 hours. CBG: Recent Labs  Lab 11/09/2019 2110  GLUCAP 116*   Lipid Profile: No results for input(s): CHOL, HDL, LDLCALC, TRIG, CHOLHDL, LDLDIRECT in the last 72 hours. Thyroid Function Tests: No results for input(s): TSH, T4TOTAL, FREET4, T3FREE, THYROIDAB in the last 72 hours. Anemia Panel: No results for input(s): VITAMINB12, FOLATE, FERRITIN, TIBC, IRON, RETICCTPCT in the last 72 hours. Urine analysis:    Component Value Date/Time   COLORURINE YELLOW 09/14/2014 1429   APPEARANCEUR CLOUDY (A) 09/14/2014 1429   LABSPEC 1.011 09/14/2014 1429   PHURINE 5.5 09/14/2014 1429   GLUCOSEU NEGATIVE 09/14/2014 1429   HGBUR TRACE (A) 09/14/2014 1429   BILIRUBINUR NEGATIVE 09/14/2014 1429   KETONESUR NEGATIVE 09/14/2014 1429   PROTEINUR NEGATIVE 09/14/2014 1429   UROBILINOGEN 0.2 09/14/2014 1429   NITRITE NEGATIVE 09/14/2014 1429   LEUKOCYTESUR TRACE (A) 09/14/2014 1429   Sepsis Labs: @LABRCNTIP (procalcitonin:4,lacticidven:4) )No results found for this or any previous visit (from the past 240 hour(s)).   Radiological Exams on Admission: CT HEAD CODE STROKE WO CONTRAST  Result Date: 10/30/2019 CLINICAL DATA:  Code stroke. Unresponsive. Intracranial hemorrhage suspected. EXAM: CT HEAD WITHOUT CONTRAST TECHNIQUE: Contiguous axial images were obtained from the base of the skull  through the vertex without intravenous contrast. COMPARISON:  07/07/2011 FINDINGS: Brain: 10 x 7 x 6 cm intraparenchymal hemorrhage with the epicenter in the left thalamus or basal ganglia. Subependymal extension of hemorrhage along the ventricular system. Marked mass effect with left-to-right shift of 2 cm. Small amount of free intraventricular blood. Elsewhere, the brain shows age related atrophy and chronic small-vessel ischemic changes with old infarction in the right basal ganglia. Vascular: There is atherosclerotic calcification of the major vessels at the base of the brain. Skull: Negative Sinuses/Orbits: Chronic sinusitis of the left sphenoid. Other sinuses clear. Orbits negative. Other: None IMPRESSION: 1. Massive intraparenchymal hemorrhage on the left with the epicenter in the thalamus/basal ganglia region measuring 10 x 7 x 6 cm (volume approximately 200 cc) surrounding edema. Sub ependymal and intraventricular extension. Left-to-right midline shift 2 cm. These results were communicated to Dr. Cheral Marker at [facility] 1/1/2021by text page  via the [employer] system. Electronically Signed   By: Nelson Chimes M.D.   On: 11/10/2019 21:23    EKG: Independently reviewed.  Sinus bradycardia.  Assessment/Plan Principal Problem:   Intracranial bleed ([place])    1. Massive intraparenchymal hemorrhage on the left side with surrounding edema with intraventricular extension and left-to-right midline shift. 2. Elevated blood pressure readings. 3. History of stroke. 4. History of hyperlipidemia. 5. Sinus bradycardia.  Plan -at this time after discussing with patient's daughter given that patient has poor prognosis with massive intraparenchymal hemorrhage patient's daughter wants to make patient only on comfort measures and Ativan and morphine drip has been started.  No further labs.  Patient is a DNR.  Given that patient has a massive intracranial hemorrhage and could be likely terminal will need  inpatient status.   DVT prophylaxis: SCDs. Code Status: DNR. Family Communication: Patient's daughter. Disposition Plan: Likely terminal. Consults called: Neurology. Admission status: Inpatient.   Rise Patience MD [facility] Pager [phone].  If 7PM-7AM, please contact night-coverage [email or web address] Password [email or web address]  2019-11-14, 1:07 AM

## 2019-11-30 DEATH — deceased

## 2020-04-28 IMAGING — CT CT HEAD CODE STROKE
3 series · 14 of 47 positions shown, 16 images · non-contrast
Comparison: 07/07/2011

CLINICAL DATA: Code stroke. Unresponsive. Intracranial hemorrhage
suspected.

EXAM:
CT HEAD WITHOUT CONTRAST
TECHNIQUE: Contiguous axial images were obtained from the base of the skull
through the vertex without intravenous contrast.

[Series 3: head 5.0 st · axial · 0.45mm/px · z∈[-99,+61]mm · 8 of 38 slices shown, 10 images]
[im 3/38  brain]
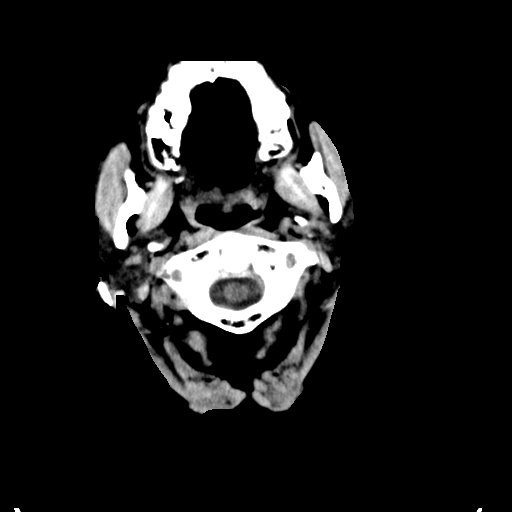
[im 3/38  bone]
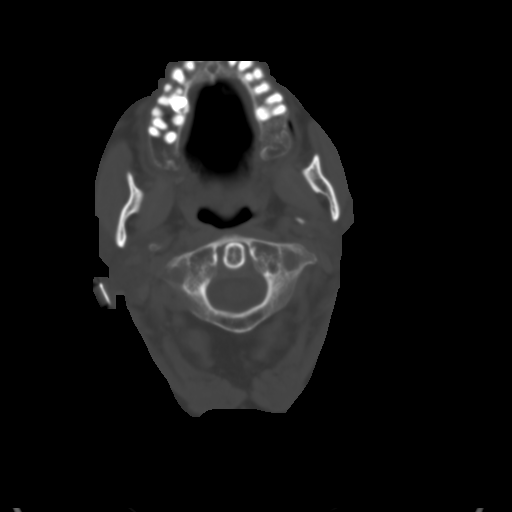
[im 8/38  brain]
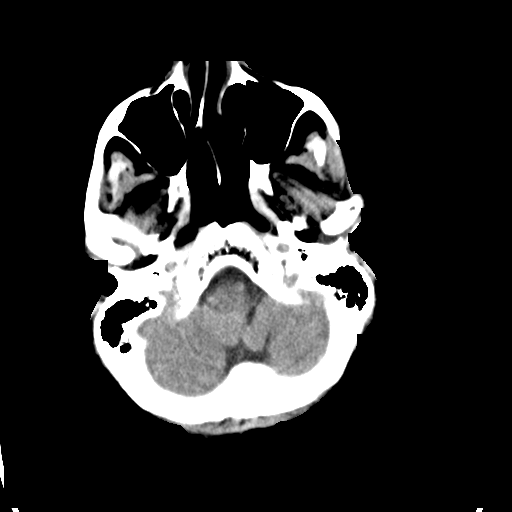
[im 12/38  brain]
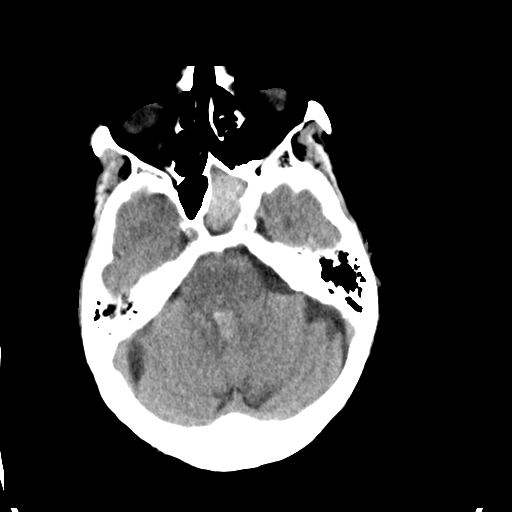
[im 17/38  brain]
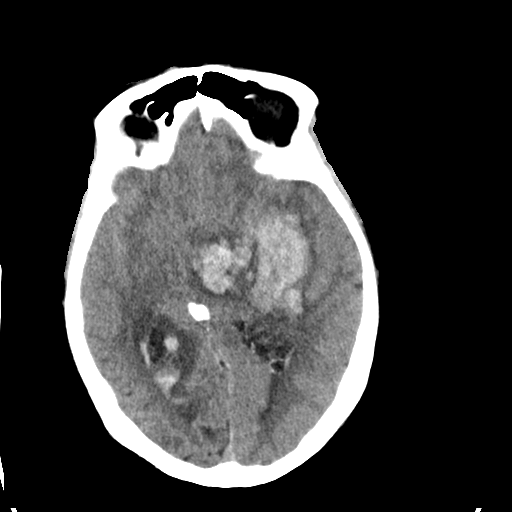
[im 21/38  brain]
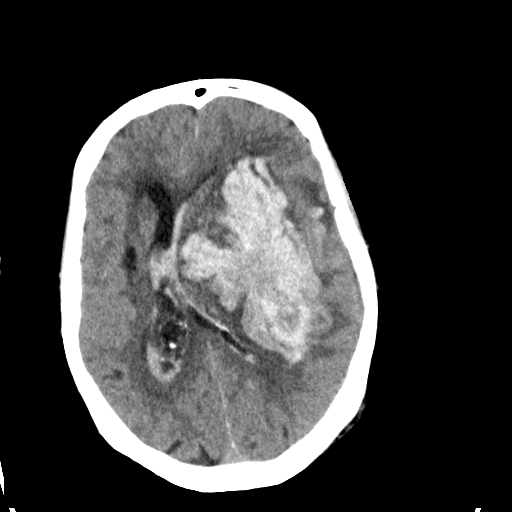
[im 21/38  bone]
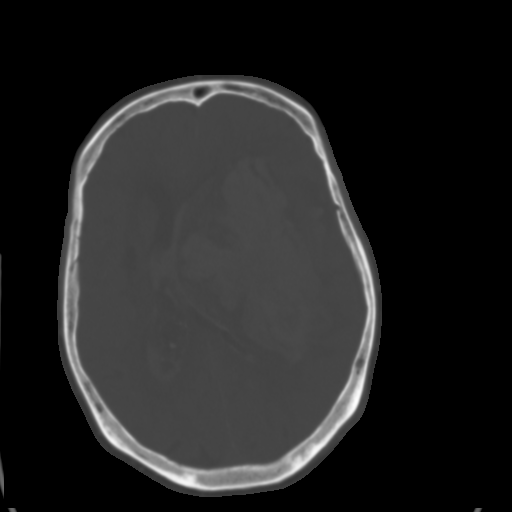
[im 26/38  brain]
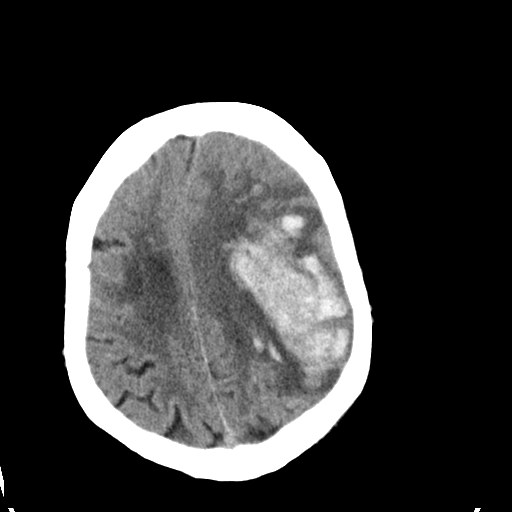
[im 30/38  brain]
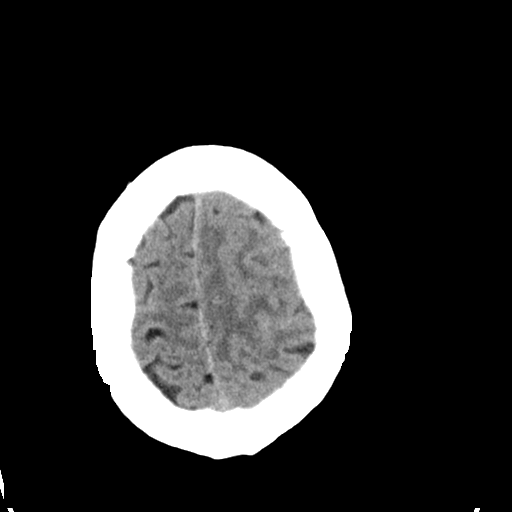
[im 35/38  brain]
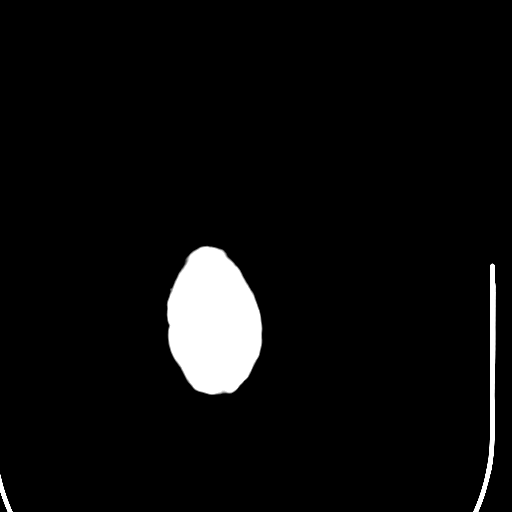

[Series 5: head 3.0 cor st · coronal · 0.38mm/px · 3 of 76 slices shown]
[im 26/76  brain]
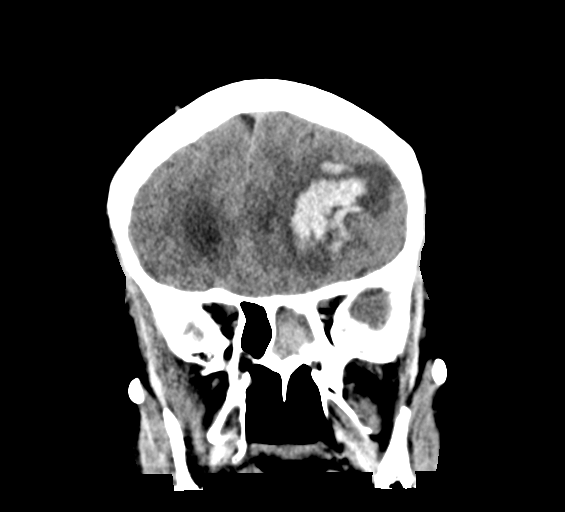
[im 34/76  brain]
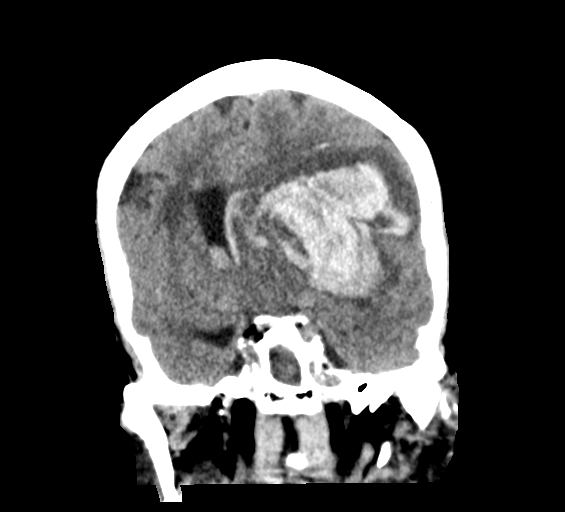
[im 42/76  brain]
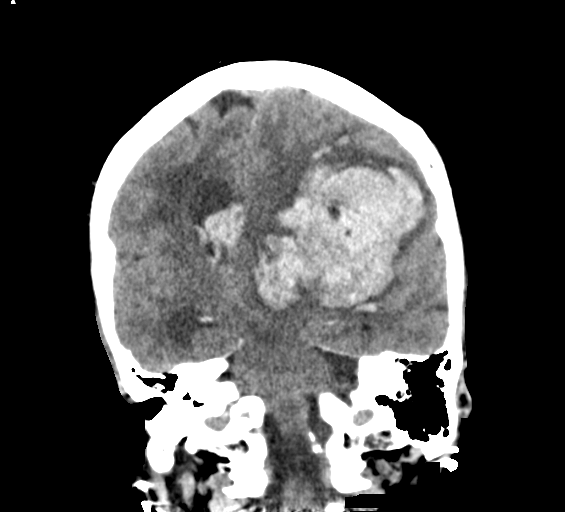

[Series 6: head 3.0 sag st · sagittal · 0.38mm/px · 3 of 59 slices shown]
[im 20/59  brain]
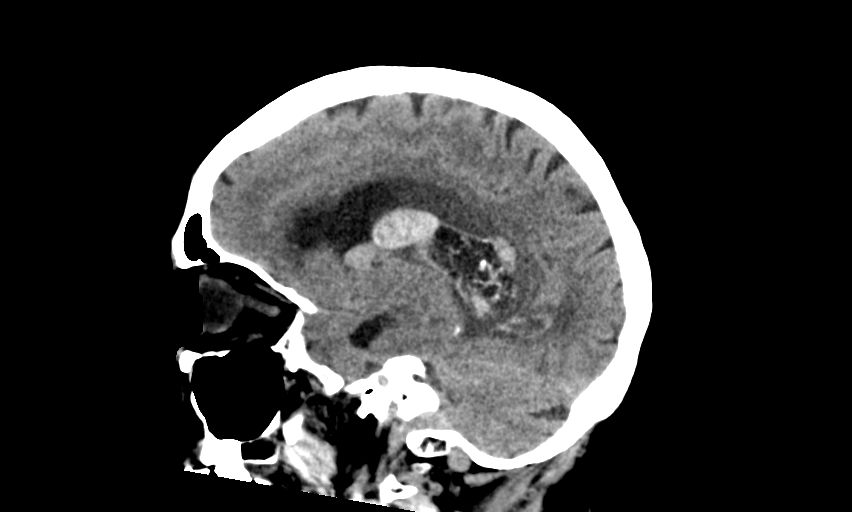
[im 30/59  brain]
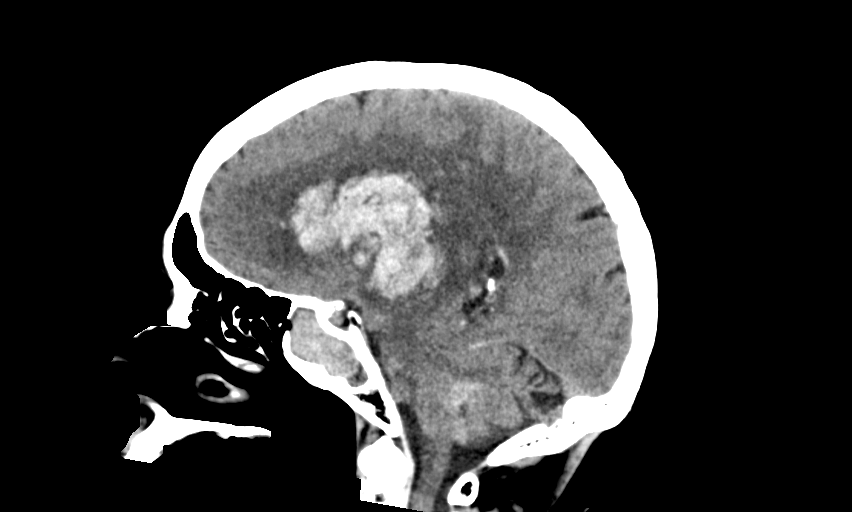
[im 39/59  brain]
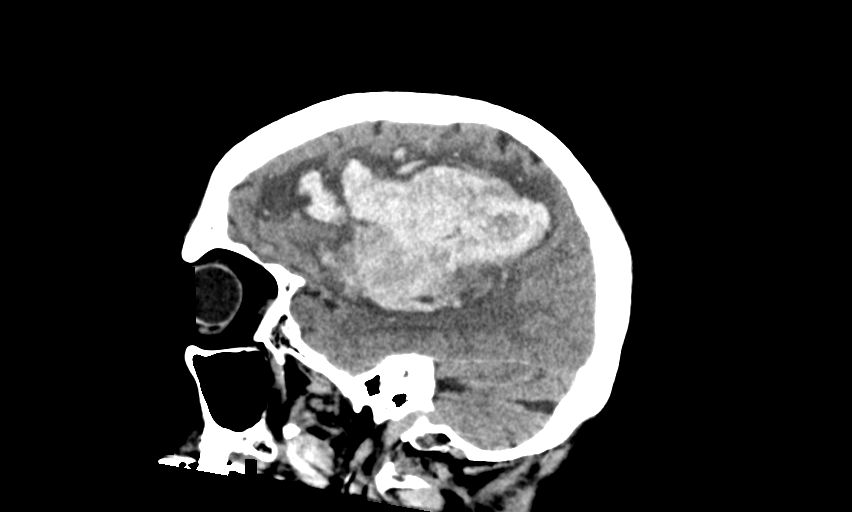

[14 of 47 positions shown; findings below may reference images not displayed]

FINDINGS: Brain: 10 x 7 x 6 cm intraparenchymal hemorrhage with the epicenter
in the left thalamus or basal ganglia. Subependymal extension of
hemorrhage along the ventricular system. Marked mass effect with
left-to-right shift of 2 cm. Small amount of free intraventricular
blood. Elsewhere, the brain shows age related atrophy and chronic
small-vessel ischemic changes with old infarction in the right basal
ganglia.

Vascular: There is atherosclerotic calcification of the major
vessels at the base of the brain.

Skull: Negative

Sinuses/Orbits: Chronic sinusitis of the left sphenoid. Other
sinuses clear. Orbits negative.

Other: None
IMPRESSION: 1. Massive intraparenchymal hemorrhage on the left with the
epicenter in the thalamus/basal ganglia region measuring 10 x 7 x 6
cm (volume approximately 200 cc) surrounding edema. Sub ependymal
and intraventricular extension. Left-to-right midline shift 2 cm.

These results were communicated to Dr. Valente at [DATE] pmon
10/30/2019by text page via the [employer] messaging system.
